# Patient Record
Sex: Female | Born: 1985 | Race: Black or African American | Hispanic: No | Marital: Married | Smoking: Never smoker
Health system: Southern US, Community
[De-identification: ages and names within clinical notes are randomized; demographics above are authoritative.]

## PROBLEM LIST (undated history)

## (undated) DIAGNOSIS — D649 Anemia, unspecified: Secondary | ICD-10-CM

## (undated) DIAGNOSIS — Z789 Other specified health status: Secondary | ICD-10-CM

## (undated) DIAGNOSIS — R030 Elevated blood-pressure reading, without diagnosis of hypertension: Secondary | ICD-10-CM

## (undated) DIAGNOSIS — R87629 Unspecified abnormal cytological findings in specimens from vagina: Secondary | ICD-10-CM

## (undated) DIAGNOSIS — E282 Polycystic ovarian syndrome: Secondary | ICD-10-CM

## (undated) HISTORY — PX: HERNIA REPAIR: SHX51

## (undated) HISTORY — PX: OTHER SURGICAL HISTORY: SHX169

## (undated) HISTORY — PX: DILATION AND CURETTAGE OF UTERUS: SHX78

## (undated) HISTORY — DX: Unspecified abnormal cytological findings in specimens from vagina: R87.629

---

## 2008-07-11 HISTORY — PX: DILATION AND CURETTAGE OF UTERUS: SHX78

## 2008-08-05 ENCOUNTER — Inpatient Hospital Stay (HOSPITAL_COMMUNITY): Admission: AD | Admit: 2008-08-05 | Discharge: 2008-08-05 | Payer: Self-pay | Admitting: Obstetrics & Gynecology

## 2008-08-12 ENCOUNTER — Inpatient Hospital Stay (HOSPITAL_COMMUNITY): Admission: RE | Admit: 2008-08-12 | Discharge: 2008-08-12 | Payer: Self-pay | Admitting: Obstetrics & Gynecology

## 2008-12-28 ENCOUNTER — Ambulatory Visit: Payer: Self-pay | Admitting: Obstetrics and Gynecology

## 2008-12-28 ENCOUNTER — Inpatient Hospital Stay (HOSPITAL_COMMUNITY): Admission: AD | Admit: 2008-12-28 | Discharge: 2008-12-28 | Payer: Self-pay | Admitting: Obstetrics & Gynecology

## 2009-01-24 ENCOUNTER — Inpatient Hospital Stay (HOSPITAL_COMMUNITY): Admission: AD | Admit: 2009-01-24 | Discharge: 2009-01-24 | Payer: Self-pay | Admitting: Obstetrics and Gynecology

## 2009-02-16 ENCOUNTER — Ambulatory Visit: Payer: Self-pay | Admitting: Advanced Practice Midwife

## 2009-02-16 ENCOUNTER — Inpatient Hospital Stay (HOSPITAL_COMMUNITY): Admission: AD | Admit: 2009-02-16 | Discharge: 2009-02-16 | Payer: Self-pay | Admitting: Obstetrics & Gynecology

## 2009-03-20 ENCOUNTER — Inpatient Hospital Stay (HOSPITAL_COMMUNITY): Admission: AD | Admit: 2009-03-20 | Discharge: 2009-03-21 | Payer: Self-pay | Admitting: Obstetrics

## 2009-05-15 ENCOUNTER — Ambulatory Visit (HOSPITAL_COMMUNITY): Admission: RE | Admit: 2009-05-15 | Discharge: 2009-05-15 | Payer: Self-pay | Admitting: Obstetrics

## 2009-06-07 ENCOUNTER — Inpatient Hospital Stay (HOSPITAL_COMMUNITY): Admission: AD | Admit: 2009-06-07 | Discharge: 2009-06-17 | Payer: Self-pay | Admitting: Obstetrics

## 2009-06-27 ENCOUNTER — Inpatient Hospital Stay (HOSPITAL_COMMUNITY): Admission: AD | Admit: 2009-06-27 | Discharge: 2009-07-09 | Payer: Self-pay | Admitting: Obstetrics

## 2009-06-27 ENCOUNTER — Ambulatory Visit: Payer: Self-pay | Admitting: Advanced Practice Midwife

## 2009-07-09 ENCOUNTER — Encounter: Payer: Self-pay | Admitting: Obstetrics

## 2009-07-15 ENCOUNTER — Inpatient Hospital Stay (HOSPITAL_COMMUNITY): Admission: AD | Admit: 2009-07-15 | Discharge: 2009-07-16 | Payer: Self-pay | Admitting: Obstetrics & Gynecology

## 2009-07-16 ENCOUNTER — Ambulatory Visit (HOSPITAL_COMMUNITY): Admission: RE | Admit: 2009-07-16 | Discharge: 2009-07-16 | Payer: Self-pay | Admitting: Obstetrics

## 2009-07-23 ENCOUNTER — Encounter: Payer: Self-pay | Admitting: Obstetrics

## 2009-07-23 ENCOUNTER — Ambulatory Visit (HOSPITAL_COMMUNITY): Admission: RE | Admit: 2009-07-23 | Discharge: 2009-07-23 | Payer: Self-pay | Admitting: Obstetrics

## 2009-07-23 ENCOUNTER — Inpatient Hospital Stay (HOSPITAL_COMMUNITY): Admission: AD | Admit: 2009-07-23 | Discharge: 2009-07-26 | Payer: Self-pay | Admitting: Obstetrics

## 2010-02-26 ENCOUNTER — Inpatient Hospital Stay (HOSPITAL_COMMUNITY): Admission: AD | Admit: 2010-02-26 | Discharge: 2010-02-27 | Payer: Self-pay | Admitting: Obstetrics & Gynecology

## 2010-02-26 ENCOUNTER — Ambulatory Visit: Payer: Self-pay | Admitting: Physician Assistant

## 2010-03-01 ENCOUNTER — Ambulatory Visit (HOSPITAL_COMMUNITY): Admission: RE | Admit: 2010-03-01 | Discharge: 2010-03-01 | Payer: Self-pay | Admitting: Obstetrics & Gynecology

## 2010-04-07 IMAGING — US US OB LIMITED
1 series · 18 of 24 positions shown · non-contrast
Comparison: none

OBSTETRICAL ULTRASOUND:
 This ultrasound was performed in The [HOSPITAL], and the [facility] report will be stored to [REDACTED] PACS.  This report is also available in [HOSPITAL]?s accessANYware.

[Series 1: us ob limited · 18 of 24 slices shown]
[im 1/24]
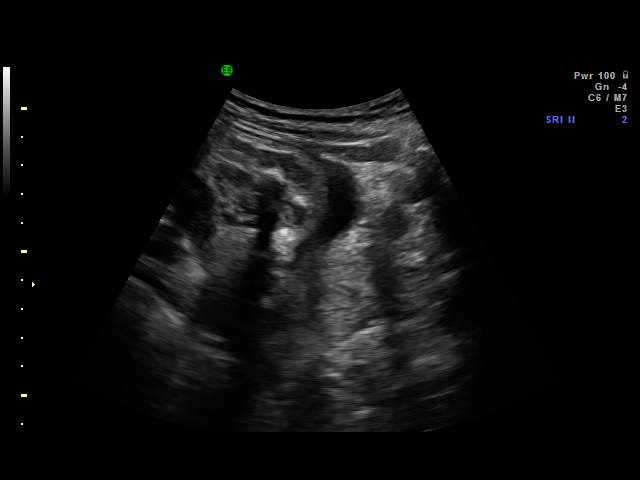
[im 3/24]
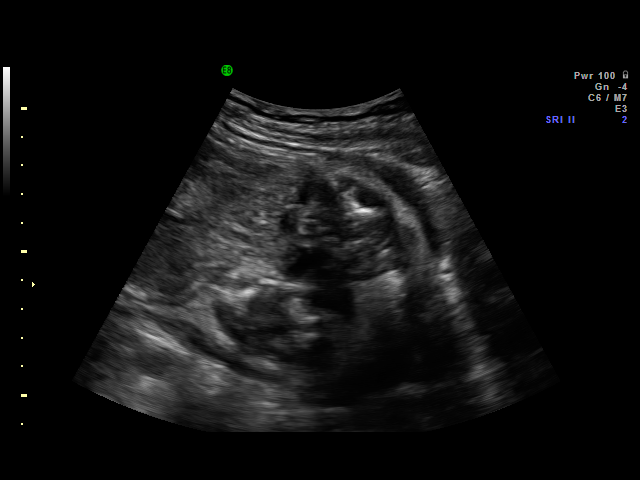
[im 4/24]
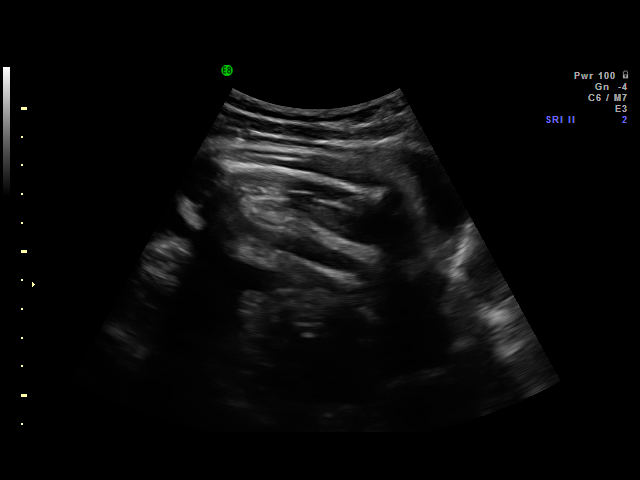
[im 5/24]
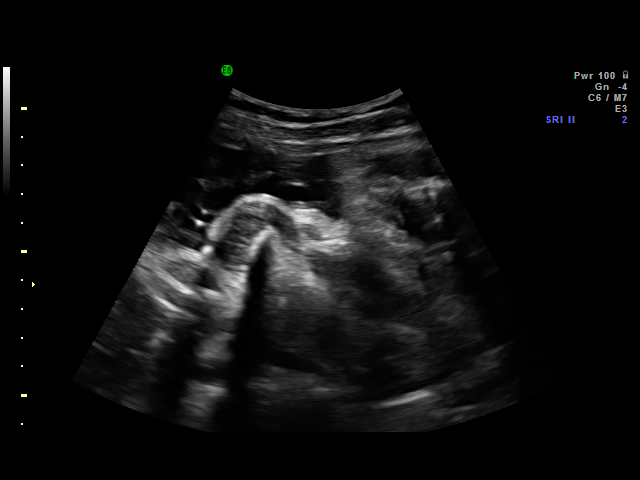
[im 7/24]
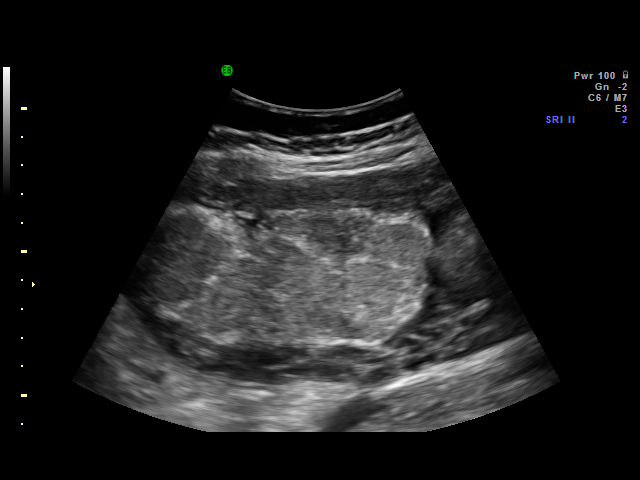
[im 8/24]
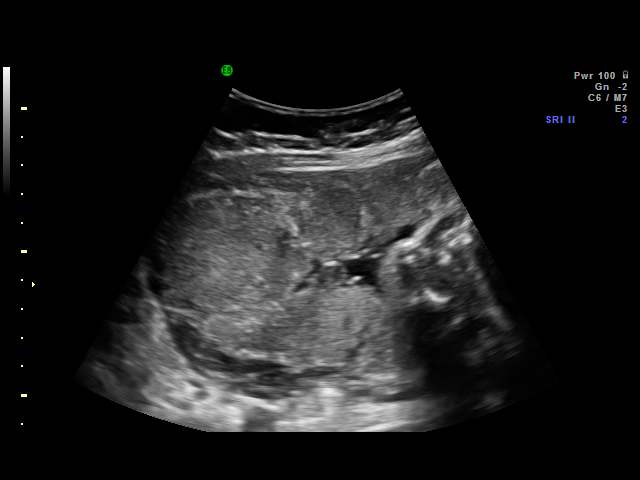
[im 9/24]
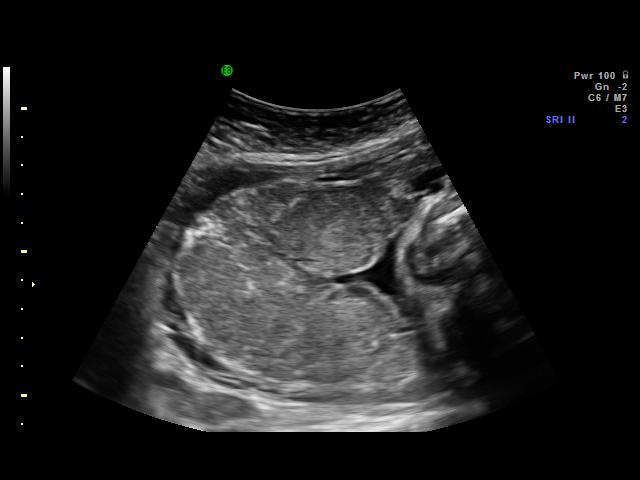
[im 11/24]
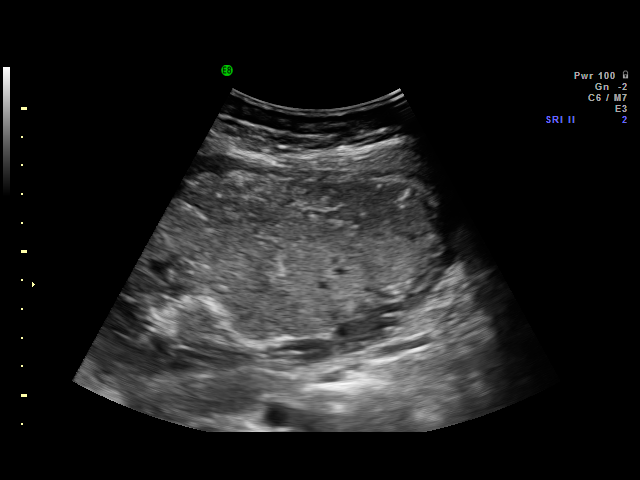
[im 12/24]
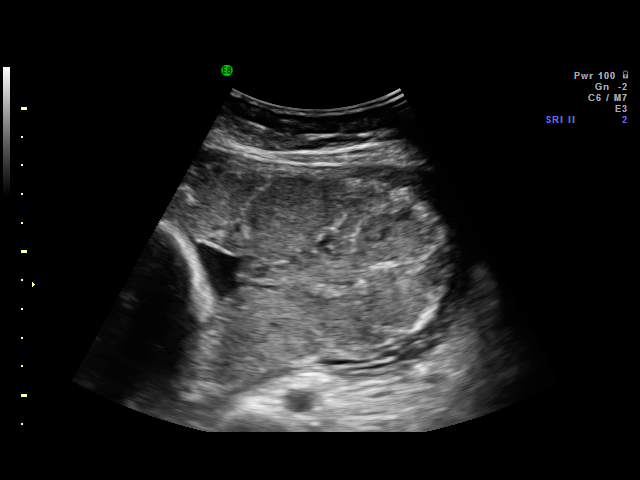
[im 13/24]
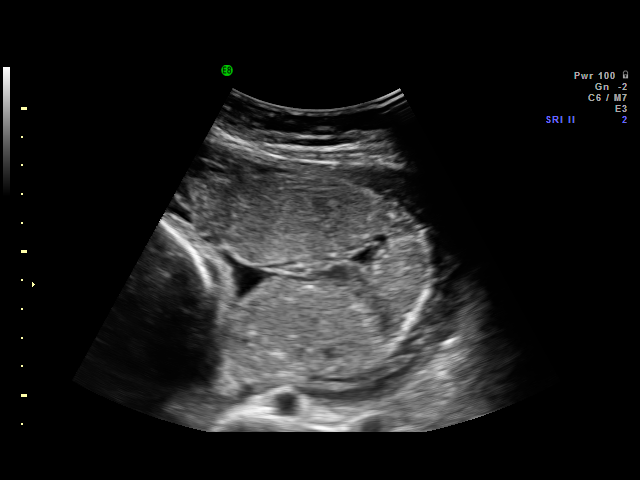
[im 15/24]
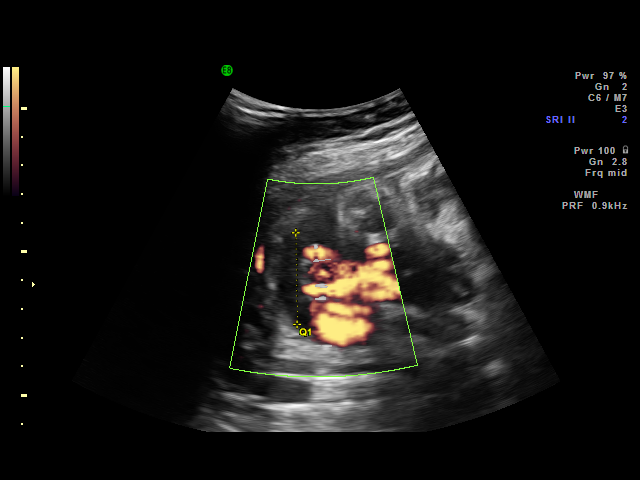
[im 16/24]
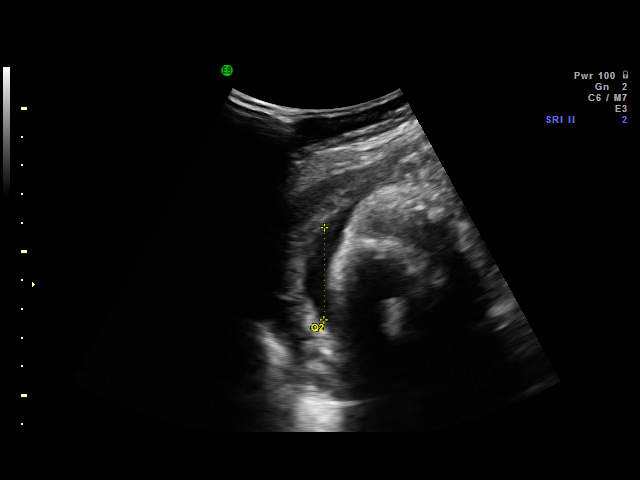
[im 17/24]
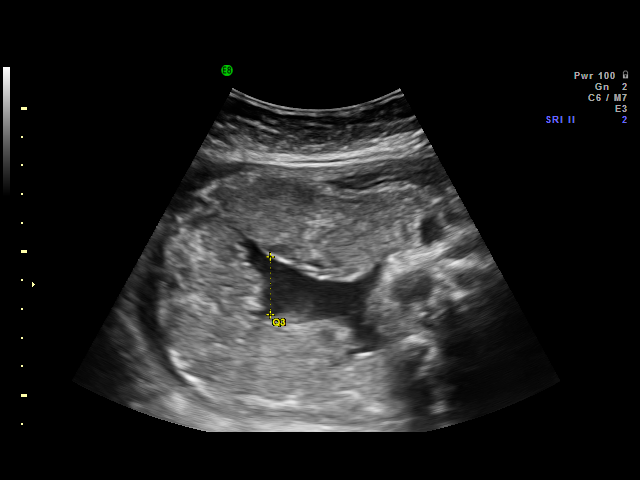
[im 19/24]
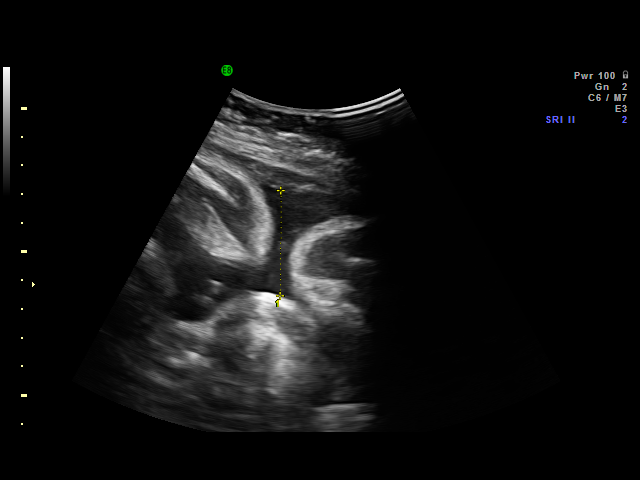
[im 20/24]
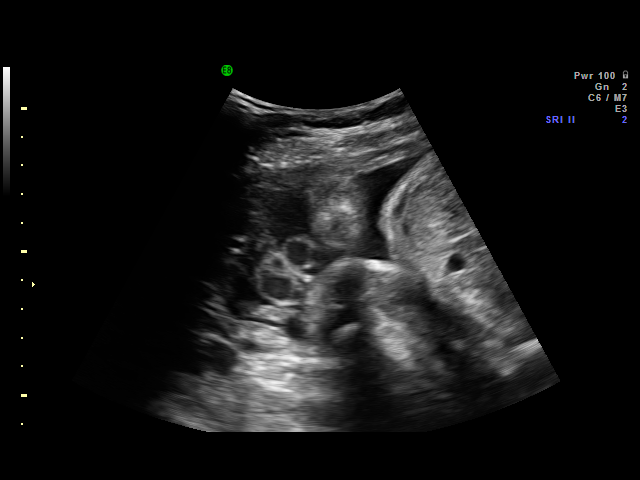
[im 21/24]
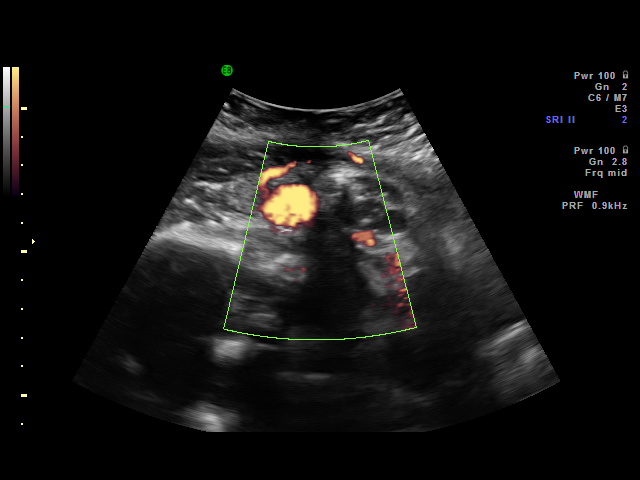
[im 23/24]
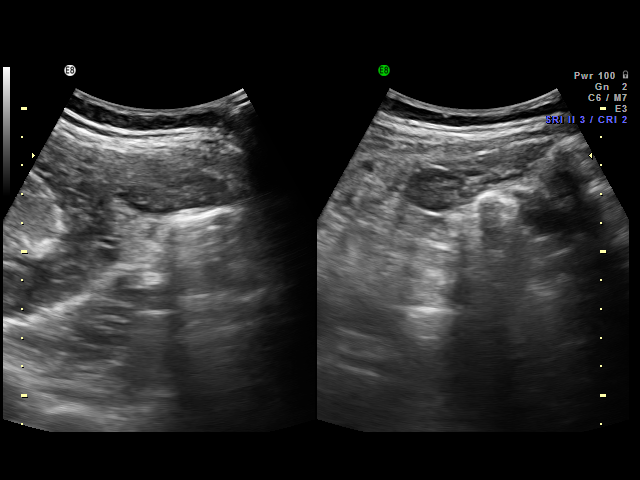
[im 24/24]
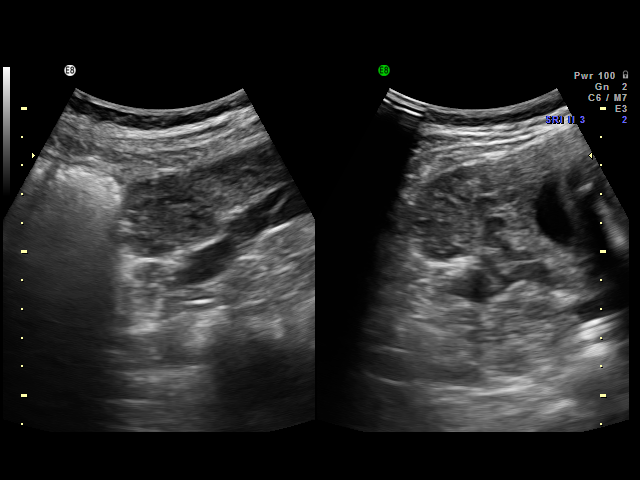

[18 of 24 positions shown; findings below may reference images not displayed]

IMPRESSION: [facility] has also been faxed to the ordering physician.

## 2010-04-13 ENCOUNTER — Inpatient Hospital Stay (HOSPITAL_COMMUNITY): Admission: AD | Admit: 2010-04-13 | Discharge: 2010-04-13 | Payer: Self-pay | Admitting: Family Medicine

## 2010-04-13 ENCOUNTER — Ambulatory Visit: Payer: Self-pay | Admitting: Gynecology

## 2010-05-07 ENCOUNTER — Inpatient Hospital Stay (HOSPITAL_COMMUNITY): Admission: AD | Admit: 2010-05-07 | Discharge: 2010-05-07 | Payer: Self-pay | Admitting: Obstetrics & Gynecology

## 2010-05-07 ENCOUNTER — Ambulatory Visit: Payer: Self-pay | Admitting: Obstetrics and Gynecology

## 2010-06-11 ENCOUNTER — Ambulatory Visit (HOSPITAL_COMMUNITY)
Admission: RE | Admit: 2010-06-11 | Discharge: 2010-06-11 | Payer: Self-pay | Source: Home / Self Care | Admitting: Obstetrics

## 2010-08-01 ENCOUNTER — Encounter: Payer: Self-pay | Admitting: Obstetrics

## 2010-08-01 ENCOUNTER — Encounter: Payer: Self-pay | Admitting: Obstetrics & Gynecology

## 2010-08-23 ENCOUNTER — Inpatient Hospital Stay (HOSPITAL_COMMUNITY)
Admission: AD | Admit: 2010-08-23 | Discharge: 2010-08-23 | Disposition: A | Discharge status: Home / Self Care | Payer: Medicaid Other | Source: Ambulatory Visit | Attending: Obstetrics & Gynecology | Admitting: Obstetrics & Gynecology

## 2010-08-23 DIAGNOSIS — R109 Unspecified abdominal pain: Secondary | ICD-10-CM | POA: Insufficient documentation

## 2010-08-23 DIAGNOSIS — O99891 Other specified diseases and conditions complicating pregnancy: Secondary | ICD-10-CM | POA: Insufficient documentation

## 2010-08-23 DIAGNOSIS — O9989 Other specified diseases and conditions complicating pregnancy, childbirth and the puerperium: Secondary | ICD-10-CM

## 2010-08-23 LAB — URINALYSIS, ROUTINE W REFLEX MICROSCOPIC
Ketones, ur: NEGATIVE mg/dL
Specific Gravity, Urine: 1.015 (ref 1.005–1.030)
Urine Glucose, Fasting: NEGATIVE mg/dL
pH: 6.5 (ref 5.0–8.0)

## 2010-09-22 LAB — URINALYSIS, ROUTINE W REFLEX MICROSCOPIC
Hgb urine dipstick: NEGATIVE
Nitrite: NEGATIVE
Protein, ur: NEGATIVE mg/dL
Specific Gravity, Urine: >1.03 — ABNORMAL HIGH (ref 1.005–1.030)
Urobilinogen, UA: 1 mg/dL (ref 0.0–1.0)

## 2010-09-22 LAB — WET PREP, GENITAL: Yeast Wet Prep HPF POC: NONE SEEN

## 2010-09-23 ENCOUNTER — Inpatient Hospital Stay (HOSPITAL_COMMUNITY)
Admission: AD | Admit: 2010-09-23 | Discharge: 2010-09-25 | DRG: 778 | Disposition: A | Discharge status: Home / Self Care | Payer: Medicaid Other | Source: Ambulatory Visit | Attending: Obstetrics & Gynecology | Admitting: Obstetrics & Gynecology

## 2010-09-23 DIAGNOSIS — O34219 Maternal care for unspecified type scar from previous cesarean delivery: Secondary | ICD-10-CM | POA: Diagnosis present

## 2010-09-23 DIAGNOSIS — O4700 False labor before 37 completed weeks of gestation, unspecified trimester: Principal | ICD-10-CM | POA: Diagnosis present

## 2010-09-23 LAB — COMPREHENSIVE METABOLIC PANEL
ALT: 27 U/L (ref 0–35)
Alkaline Phosphatase: 57 U/L (ref 39–117)
Chloride: 104 mEq/L (ref 96–112)
Glucose, Bld: 71 mg/dL (ref 70–99)
Potassium: 3.6 mEq/L (ref 3.5–5.1)
Sodium: 136 mEq/L (ref 135–145)
Total Bilirubin: 0.8 mg/dL (ref 0.3–1.2)
Total Protein: 6.9 g/dL (ref 6.0–8.3)

## 2010-09-23 LAB — CBC
HCT: 38.2 % (ref 36.0–46.0)
Hemoglobin: 12.8 g/dL (ref 12.0–15.0)
Platelets: 184 10*3/uL (ref 150–400)
RBC: 3.97 MIL/uL (ref 3.87–5.11)
RBC: 4.19 MIL/uL (ref 3.87–5.11)
RDW: 13 % (ref 11.5–15.5)
WBC: 9.6 10*3/uL (ref 4.0–10.5)
WBC: 9.6 10*3/uL (ref 4.0–10.5)

## 2010-09-23 LAB — URINALYSIS, ROUTINE W REFLEX MICROSCOPIC
Glucose, UA: NEGATIVE mg/dL
Hgb urine dipstick: NEGATIVE
Ketones, ur: 15 mg/dL — AB
Protein, ur: NEGATIVE mg/dL

## 2010-09-23 LAB — WET PREP, GENITAL

## 2010-09-23 LAB — GC/CHLAMYDIA PROBE AMP, GENITAL: GC Probe Amp, Genital: NEGATIVE

## 2010-09-24 LAB — WET PREP, GENITAL
Trich, Wet Prep: NONE SEEN
Yeast Wet Prep HPF POC: NONE SEEN

## 2010-09-24 LAB — URINALYSIS, ROUTINE W REFLEX MICROSCOPIC
Nitrite: NEGATIVE
Specific Gravity, Urine: >1.03 — ABNORMAL HIGH (ref 1.005–1.030)
pH: 6 (ref 5.0–8.0)

## 2010-09-26 ENCOUNTER — Inpatient Hospital Stay (HOSPITAL_COMMUNITY)
Admission: AD | Admit: 2010-09-26 | Discharge: 2010-09-26 | Disposition: A | Discharge status: Home / Self Care | Payer: Medicaid Other | Source: Ambulatory Visit | Attending: Obstetrics & Gynecology | Admitting: Obstetrics & Gynecology

## 2010-09-26 DIAGNOSIS — O4700 False labor before 37 completed weeks of gestation, unspecified trimester: Secondary | ICD-10-CM | POA: Insufficient documentation

## 2010-09-26 LAB — CBC
HCT: 37.3 % (ref 36.0–46.0)
Hemoglobin: 11.5 g/dL — ABNORMAL LOW (ref 12.0–15.0)
MCHC: 33.3 g/dL (ref 30.0–36.0)
MCV: 90.7 fL (ref 78.0–100.0)
Platelets: 227 10*3/uL (ref 150–400)
RDW: 13.4 % (ref 11.5–15.5)
RDW: 13.7 % (ref 11.5–15.5)
WBC: 11.3 10*3/uL — ABNORMAL HIGH (ref 4.0–10.5)

## 2010-09-29 NOTE — Discharge Summary (Signed)
  NAMEVENICE, MARCUCCI             ACCOUNT NO.:  [id number]  MEDICAL RECORD NO.:  [id number]           PATIENT TYPE:  I  LOCATION:  9157                          FACILITY:  [facility]  PHYSICIAN:  Roseanna Rainbow, M.D.DATE OF BIRTH:  05-07-1986  DATE OF ADMISSION:  09/23/2010 DATE OF DISCHARGE:  09/25/2010                              DISCHARGE SUMMARY   CHIEF COMPLAINT:  The patient is a 25 year old gravida 2, para 0-1-1-1 with an estimated date of confinement of April 15 with a history of a previous cesarean delivery, complaining of contractions.  HISTORY OF PRESENT ILLNESS:  Please see the above.  The patient has a history of previous preterm delivery via cesarean delivery at 34 weeks.  PHYSICAL EXAMINATION:  Vital signs afebrile.  Tocodynamometer uterine contractions every 5 minutes.  Fetal heart tracing baseline 140s, moderate long-term variability.  The cervix is fingertip long per the RN.  ASSESSMENT:  Intrauterine pregnancy at 35 weeks with a history of previous cesarean delivery, threatened labor.  PLAN:  Admission tocolysis with magnesium sulfate, bed rest.  HOSPITAL COURSE:  The patient was admitted.  Parenteral magnesium sulfate for tocolysis was started.  She was readily tocolyzed.  After a 24-hour period, the magnesium sulfate was discontinued.  After another 24-hour period of observation, there was no progressive preterm labor.  LABORATORY DATA:  Hemoglobin 11.2, white blood cell count of 9.6.  DISCHARGE DIAGNOSIS:  Intrauterine pregnancy at 35+ weeks, history of previous cesarean delivery, threatened preterm labor.  CONDITION:  Stable.  DIET:  Regular.  ACTIVITY:  Pelvic rest, decreased activity.  MEDICATIONS:  Prenatal vitamins.  DISPOSITION:  The patient was to follow up in the office in several days.     Roseanna Rainbow, M.D.     Judee Clara  D:  09/25/2010  T:  09/26/2010  Job:  [id number]  Electronically Signed by Antionette Char M.D.  on 09/29/2010 09:53:20 PM

## 2010-10-06 ENCOUNTER — Inpatient Hospital Stay (HOSPITAL_COMMUNITY)
Admission: AD | Admit: 2010-10-06 | Discharge: 2010-10-07 | Disposition: A | Discharge status: Home / Self Care | Payer: Medicaid Other | Source: Ambulatory Visit | Attending: Obstetrics & Gynecology | Admitting: Obstetrics & Gynecology

## 2010-10-06 DIAGNOSIS — O479 False labor, unspecified: Secondary | ICD-10-CM | POA: Insufficient documentation

## 2010-10-06 LAB — URINALYSIS, ROUTINE W REFLEX MICROSCOPIC
Glucose, UA: NEGATIVE mg/dL
Ketones, ur: >80 mg/dL — AB
Protein, ur: NEGATIVE mg/dL

## 2010-10-11 ENCOUNTER — Inpatient Hospital Stay (HOSPITAL_COMMUNITY)
Admission: AD | Admit: 2010-10-11 | Discharge: 2010-10-11 | Disposition: A | Discharge status: Home / Self Care | Payer: Medicaid Other | Source: Ambulatory Visit | Attending: Obstetrics | Admitting: Obstetrics

## 2010-10-11 ENCOUNTER — Inpatient Hospital Stay (HOSPITAL_COMMUNITY): Payer: Medicaid Other

## 2010-10-11 ENCOUNTER — Encounter (HOSPITAL_COMMUNITY): Payer: Self-pay | Admitting: Radiology

## 2010-10-11 DIAGNOSIS — O479 False labor, unspecified: Secondary | ICD-10-CM | POA: Insufficient documentation

## 2010-10-11 LAB — WET PREP, GENITAL
Clue Cells Wet Prep HPF POC: NONE SEEN
Trich, Wet Prep: NONE SEEN
Yeast Wet Prep HPF POC: NONE SEEN

## 2010-10-11 LAB — URINALYSIS, ROUTINE W REFLEX MICROSCOPIC
Bilirubin Urine: NEGATIVE
Glucose, UA: NEGATIVE mg/dL
Hgb urine dipstick: NEGATIVE
Ketones, ur: NEGATIVE mg/dL
pH: 6 (ref 5.0–8.0)

## 2010-10-11 LAB — URINE MICROSCOPIC-ADD ON

## 2010-10-11 LAB — CBC
HCT: 37.4 % (ref 36.0–46.0)
Hemoglobin: 12.6 g/dL (ref 12.0–15.0)
MCV: 91 fL (ref 78.0–100.0)
Platelets: 275 10*3/uL (ref 150–400)
RBC: 4.11 MIL/uL (ref 3.87–5.11)
WBC: 14.9 10*3/uL — ABNORMAL HIGH (ref 4.0–10.5)

## 2010-10-11 LAB — STREP B DNA PROBE

## 2010-10-13 ENCOUNTER — Other Ambulatory Visit (HOSPITAL_COMMUNITY): Payer: Medicaid Other

## 2010-10-13 LAB — URINALYSIS, ROUTINE W REFLEX MICROSCOPIC
Glucose, UA: NEGATIVE mg/dL
Ketones, ur: NEGATIVE mg/dL
Protein, ur: NEGATIVE mg/dL
Urobilinogen, UA: 0.2 mg/dL (ref 0.0–1.0)

## 2010-10-13 LAB — COMPREHENSIVE METABOLIC PANEL
BUN: 2 mg/dL — ABNORMAL LOW (ref 6–23)
CO2: 23 mEq/L (ref 19–32)
Chloride: 105 mEq/L (ref 96–112)
Creatinine, Ser: 0.51 mg/dL (ref 0.4–1.2)
GFR calc non Af Amer: >60 mL/min (ref >60)
Total Bilirubin: 0.3 mg/dL (ref 0.3–1.2)

## 2010-10-13 LAB — STREP B DNA PROBE

## 2010-10-13 LAB — CBC
HCT: 33.9 % — ABNORMAL LOW (ref 36.0–46.0)
MCV: 91.3 fL (ref 78.0–100.0)
RBC: 3.71 MIL/uL — ABNORMAL LOW (ref 3.87–5.11)
WBC: 13.1 10*3/uL — ABNORMAL HIGH (ref 4.0–10.5)

## 2010-10-13 LAB — WET PREP, GENITAL: Yeast Wet Prep HPF POC: NONE SEEN

## 2010-10-14 ENCOUNTER — Encounter (HOSPITAL_COMMUNITY)
Admission: RE | Admit: 2010-10-14 | Discharge: 2010-10-14 | Disposition: A | Discharge status: Home / Self Care | Payer: Medicaid Other | Source: Ambulatory Visit | Attending: Obstetrics | Admitting: Obstetrics

## 2010-10-14 LAB — CBC
MCH: 27.1 pg (ref 26.0–34.0)
MCV: 86.6 fL (ref 78.0–100.0)
Platelets: 176 10*3/uL (ref 150–400)
RDW: 13.8 % (ref 11.5–15.5)

## 2010-10-15 LAB — URINALYSIS, ROUTINE W REFLEX MICROSCOPIC
Glucose, UA: NEGATIVE mg/dL
Ketones, ur: NEGATIVE mg/dL
Specific Gravity, Urine: >1.03 — ABNORMAL HIGH (ref 1.005–1.030)
pH: 5.5 (ref 5.0–8.0)

## 2010-10-15 LAB — WET PREP, GENITAL
Clue Cells Wet Prep HPF POC: NONE SEEN
Trich, Wet Prep: NONE SEEN

## 2010-10-15 LAB — COMPREHENSIVE METABOLIC PANEL
ALT: 28 U/L (ref 0–35)
Calcium: 9.1 mg/dL (ref 8.4–10.5)
Creatinine, Ser: 0.48 mg/dL (ref 0.4–1.2)
GFR calc Af Amer: >60 mL/min (ref >60)
Glucose, Bld: 75 mg/dL (ref 70–99)
Sodium: 135 mEq/L (ref 135–145)
Total Protein: 6.8 g/dL (ref 6.0–8.3)

## 2010-10-15 LAB — GC/CHLAMYDIA PROBE AMP, GENITAL: GC Probe Amp, Genital: NEGATIVE

## 2010-10-18 ENCOUNTER — Inpatient Hospital Stay (HOSPITAL_COMMUNITY)
Admission: RE | Admit: 2010-10-18 | Discharge: 2010-10-21 | DRG: 766 | Disposition: A | Discharge status: Home / Self Care | Payer: Medicaid Other | Source: Ambulatory Visit | Attending: Obstetrics | Admitting: Obstetrics

## 2010-10-18 ENCOUNTER — Other Ambulatory Visit: Payer: Self-pay | Admitting: Obstetrics

## 2010-10-18 DIAGNOSIS — Z302 Encounter for sterilization: Secondary | ICD-10-CM

## 2010-10-18 DIAGNOSIS — O34219 Maternal care for unspecified type scar from previous cesarean delivery: Principal | ICD-10-CM | POA: Diagnosis present

## 2010-10-18 LAB — URINALYSIS, ROUTINE W REFLEX MICROSCOPIC
Leukocytes, UA: NEGATIVE
Nitrite: NEGATIVE
Protein, ur: NEGATIVE mg/dL
Specific Gravity, Urine: >1.03 — ABNORMAL HIGH (ref 1.005–1.030)
Urobilinogen, UA: 0.2 mg/dL (ref 0.0–1.0)

## 2010-10-18 LAB — CBC
HCT: 36.7 % (ref 36.0–46.0)
Hemoglobin: 12.4 g/dL (ref 12.0–15.0)
MCV: 89.5 fL (ref 78.0–100.0)
Platelets: 213 10*3/uL (ref 150–400)
RDW: 13.1 % (ref 11.5–15.5)

## 2010-10-18 LAB — URINE MICROSCOPIC-ADD ON

## 2010-10-18 LAB — POCT PREGNANCY, URINE: Preg Test, Ur: POSITIVE

## 2010-10-19 LAB — CBC
HCT: 32.3 % — ABNORMAL LOW (ref 36.0–46.0)
MCH: 27 pg (ref 26.0–34.0)
MCV: 85.4 fL (ref 78.0–100.0)
Platelets: 169 10*3/uL (ref 150–400)
RDW: 13.7 % (ref 11.5–15.5)

## 2010-10-25 LAB — CBC
HCT: 36.1 % (ref 36.0–46.0)
Hemoglobin: 12.3 g/dL (ref 12.0–15.0)
MCHC: 34 g/dL (ref 30.0–36.0)
Platelets: 235 10*3/uL (ref 150–400)
RDW: 12.8 % (ref 11.5–15.5)

## 2010-10-25 LAB — URINE MICROSCOPIC-ADD ON

## 2010-10-25 LAB — WET PREP, GENITAL
Clue Cells Wet Prep HPF POC: NONE SEEN
Trich, Wet Prep: NONE SEEN

## 2010-10-25 LAB — ABO/RH: ABO/RH(D): A POS

## 2010-10-25 LAB — URINALYSIS, ROUTINE W REFLEX MICROSCOPIC
Bilirubin Urine: NEGATIVE
Glucose, UA: NEGATIVE mg/dL
Specific Gravity, Urine: 1.025 (ref 1.005–1.030)
Urobilinogen, UA: 0.2 mg/dL (ref 0.0–1.0)

## 2010-10-25 LAB — GC/CHLAMYDIA PROBE AMP, GENITAL: GC Probe Amp, Genital: NEGATIVE

## 2010-10-25 NOTE — Discharge Summary (Signed)
  Robin Chapman, Robin Chapman             ACCOUNT NO.:  [id number]  MEDICAL RECORD NO.:  [id number]           PATIENT TYPE:  I  LOCATION:  9102                          [facility]ILITY:  [facility]  PHYSICIAN:  Roseanna Rainbow, M.D.DATE OF BIRTH:  December 31, 1985  DATE OF ADMISSION:  10/18/2010 DATE OF DISCHARGE:  10/21/2010                              DISCHARGE SUMMARY   CHIEF COMPLAINT:  The patient is a 25 year old gravida 3, para 1-0-1-1 with an estimated date of confinement of April 16 with a history of previous cesarean delivery.  The patient declines a TOLAC.  HISTORY OF PRESENT ILLNESS:  Please see the above.  The patient also desires a sterilization procedure.  ALLERGIES:  No known drug allergies.  MEDICATIONS:  Prenatal vitamins.  PAST SURGICAL HISTORY:  Please see the above herniorrhaphy.  PAST MEDICAL HISTORY:  She denies.  SOCIAL HISTORY:  She denies any tobacco, ethanol, or drug use.  She is single.  PHYSICAL EXAMINATION:  VITAL SIGNS:  Stable, afebrile. GENERAL APPEARANCE:  Well-nourished, well-developed. HEAD, EYES, EARS, NOSE AND THROAT:  Normocephalic, atraumatic. NECK:  Supple. LUNGS:  Clear to auscultation bilaterally. HEART:  Regular rate and rhythm. ABDOMEN:  Soft, gravid, nontender. PELVIC:  Cervix is closed. EXTREMITIES:  No clubbing, cyanosis, or edema. SKIN:  Without rash.  ASSESSMENT:  Intrauterine pregnancy at term, history of a previous cesarean delivery, declined for trial of labor after cesarean.  Desires sterilization procedure.  PLAN:  Admission, repeat cesarean delivery, and bilateral tubal ligation.  HOSPITAL COURSE:  The patient was admitted and underwent a repeat cesarean delivery and bilateral tubal ligation.  Please see the dictated operative summary for findings.  On postoperative day #1, hemoglobin was 10.2.  The remainder of her hospital course was uneventful.  She was discharged to home on postoperative day #3.  DISCHARGE DIAGNOSES:   Intrauterine pregnancy at term, history of a previous cesarean delivery, declines trial of labor after cesarean but desires sterilization procedure.  PROCEDURES:  Repeat cesarean delivery and bilateral tubal ligation.  CONDITION:  Good.  DIET:  Regular.  ACTIVITY:  Pelvic rest, progressive activity.  MEDICATIONS:  Percocet 5/325, 1-2 tablets every 6 hours as needed.  DISPOSITION:  The patient is to follow up in the office in 2 weeks.     Roseanna Rainbow, M.D.     Judee Clara  D:  10/21/2010  T:  10/21/2010  Job:  [id number]  Electronically Signed by Antionette Char M.D. on 10/24/2010 01:06:42 PM

## 2010-11-18 NOTE — Op Note (Signed)
Robin Chapman, Robin Chapman             ACCOUNT NO.:  [id number]  MEDICAL RECORD NO.:  [id number]           PATIENT TYPE:  LOCATION:                                 FACILITY:  PHYSICIAN:  Charles A. Clearance Coots, M.D.DATE OF BIRTH:  [birth year]  DATE OF PROCEDURE: DATE OF DISCHARGE:                              OPERATIVE REPORT   PREOPERATIVE DIAGNOSES:  Term pregnancy, previous cesarean section, desire repeat cesarean section, desire tubal sterilization.  POSTOPERATIVE DIAGNOSES:  Term pregnancy, previous cesarean section, desire repeat cesarean section, desire tubal sterilization.  PROCEDURE:  Repeat low transverse cesarean section and bilateral partial salpingectomy.  SURGEON:  Charles A. Clearance Coots, MD  ASSISTANT:  Mackey Birchwood, certified OR technician.  ANESTHESIA:  Spinal.  ESTIMATED BLOOD LOSS:  600 mL  IV FLUIDS:  1600 mL  URINE OUTPUT:  100 mL clear  COMPLICATIONS:  None.  Foley to gravity.  FINDINGS:  Viable female at 1312, Apgars of 9 at 1 minute, 9 at 5 minutes, weight of 7 pounds 10 ounces.  SPECIMEN:  Placenta.  DISPOSITION:  Specimen to pathology.  OPERATION:  The patient was brought to the operating room and after satisfactory spinal anesthesia, the abdomen was prepped and draped in the usual sterile fashion.  Pfannenstiel skin incision was made with a scalpel through the previous scar down to the fascia.  The fascia was nicked in the midline and the fascial incision was extended to left and to the right with curved Mayo scissors.  The superior and inferior fascial edges were taken off of the rectus muscle both blunt sharp dissection.  The rectus muscles sharply divided in the midline being careful to avoid the urinary bladder inferiorly.  The peritoneum was entered and was digitally extended to left and to the right, and the bladder blade was positioned in the incision.  The vesicouterine fold of peritoneum above the reflection of the urinary bladder was  grasped with forceps and was incised and undermined with Metzenbaum scissors.  The incision was extended to the left and to the right with Metzenbaum scissors.  The bladder flap was bluntly developed and the bladder blade was repositioned in front of the urinary bladder placing it well out of the operative field.  The uterus was then entered transversely in the lower uterine segment with a scalpel.  The uterine incision was extended to left and to the right with the bandage scissors.  The occiput was then brought up into the incision and the vertex was delivered with the aid of fundal pressure from the assistant.  The infant's mouth and nose were suctioned with a suction bulb and the delivery was completed with the aid of fundal pressure from the assistant.  Umbilical cord was doubly clamped and cut and the infant was handed off to the nursery staff.  Cord blood was obtained and the placenta was spontaneously expelled from the uterine cavity intact.  The endometrial surface was thoroughly debrided with a dry lap sponge.  The edges of the uterine incision were grasped with ring forceps.  Uterus was closed with a continuous interlocking suture of 0 Monocryl.  Hemostasis was excellent. Attention was then  turned above to the tubal ligation procedure.  The left fallopian tube was grasped in the isthmic area with a Babcock clamp and was followed from a corneal end to the fimbrial end and grasped with a Babcock clamp.  The knuckle of tube beneath the Babcock clamp was suture ligated through the mesosalpinx with 0 plain catgut.  Section of tube above the knot was excised with Metzenbaum scissors and submitted to pathology for evaluation.  There was no active bleeding from the tubal stumps and they were placed back in a normal anatomic position. Same procedure was performed on the opposite side without complications. The abdomen was then closed as follows:  the parietal peritoneum was closed with  a continuous suture of 2-0 Monocryl.  The fascia was closed with continuous suture of 0 PDS from each corner to the center. Subcutaneous tissue was thoroughly irrigated with warm saline solution. All areas of subcutaneous bleeding were coagulated with the Bovie.  Skin was then closed with surgical stainless steel staples.  Sterile bandage was applied to the incision closure.  Surgical technician indicated that all needle, sponge and instrument counts were correct x2.  The patient tolerated the procedure well, transported to the recovery room in satisfactory condition.     Charles A. Clearance Coots, M.D.     CAH/MEDQ  D:  11/12/2010  T:  11/13/2010  Job:  [id number]  Electronically Signed by Coral Ceo M.D. on 11/18/2010 09:16:11 AM

## 2011-03-10 IMAGING — US US OB DETAIL+14 WK
1 series · 12 of 28 positions shown · non-contrast
Comparison: none

[Series 1: us ob detail+14 wk · 12 of 96 slices shown]
[im 4/96]
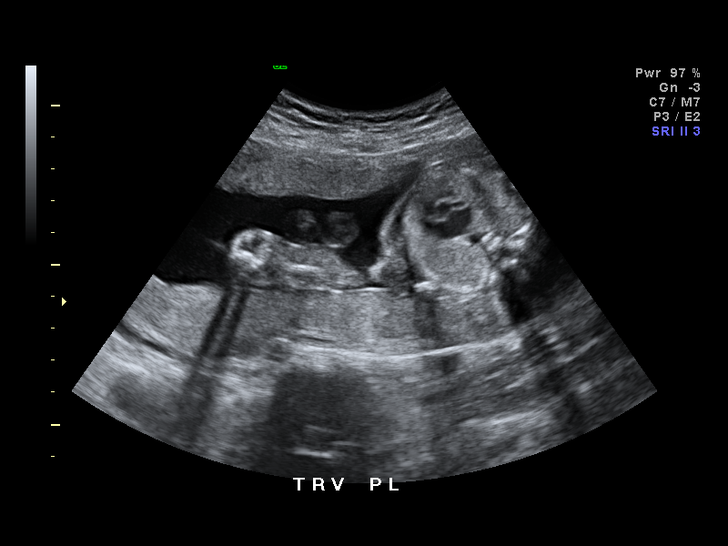
[im 11/96]
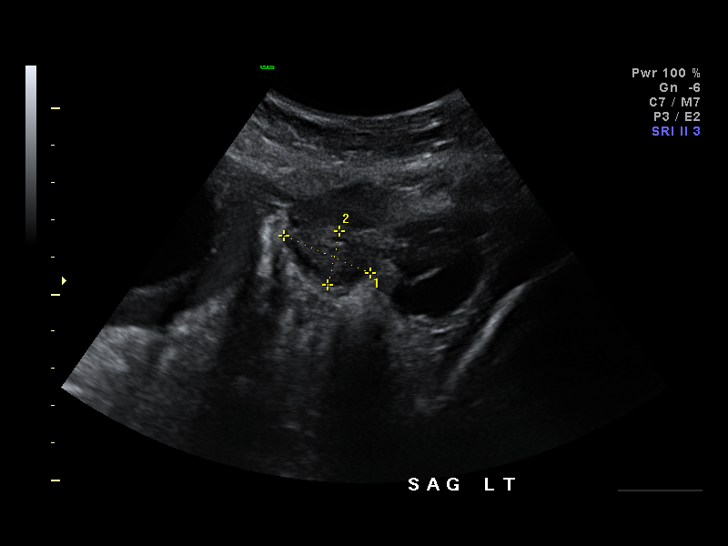
[im 18/96]
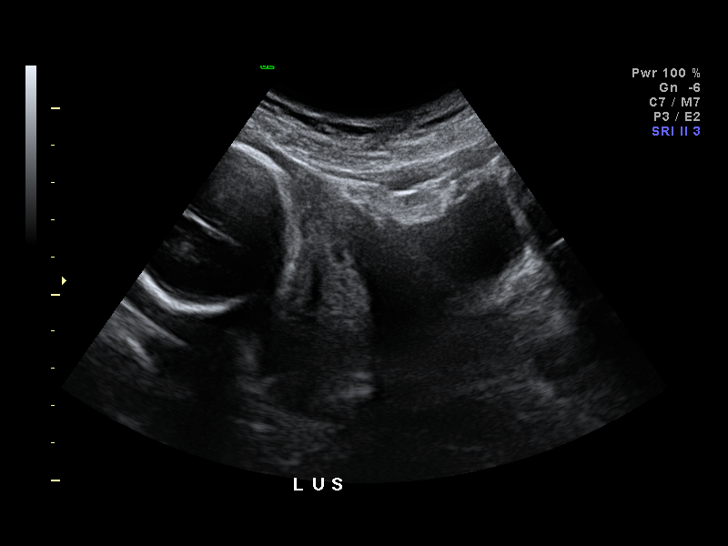
[im 29/96]
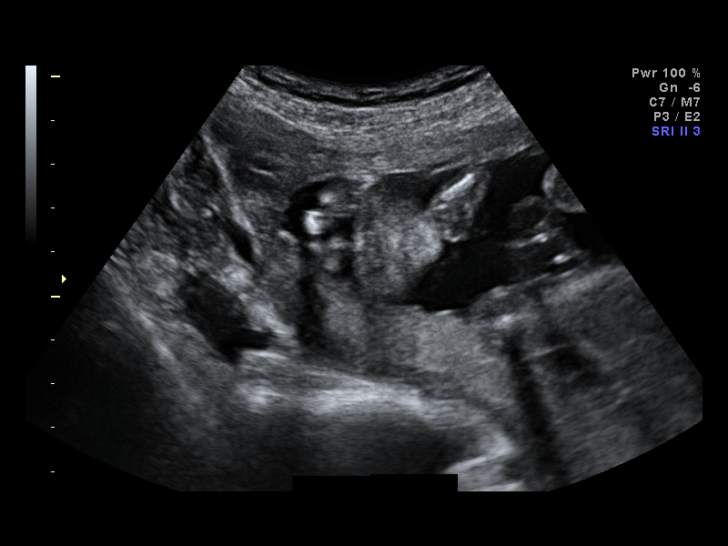
[im 36/96]
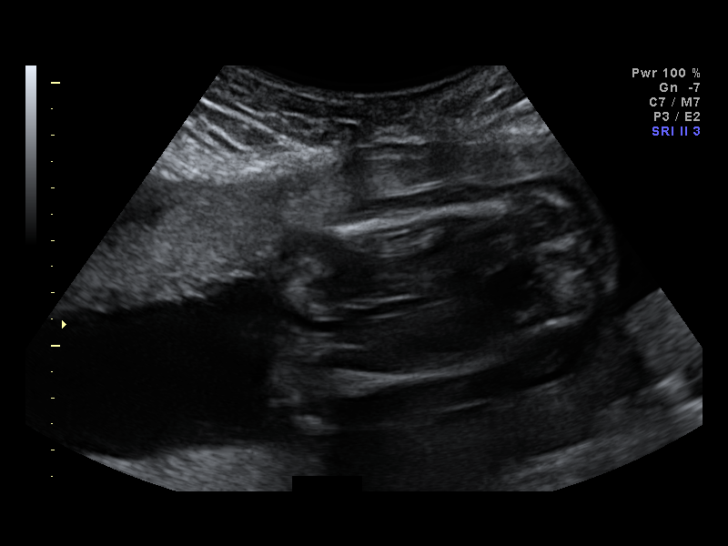
[im 43/96]
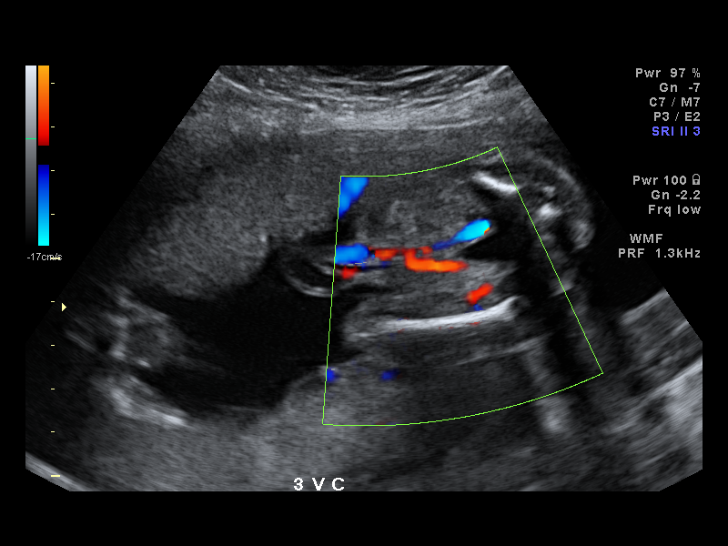
[im 53/96]
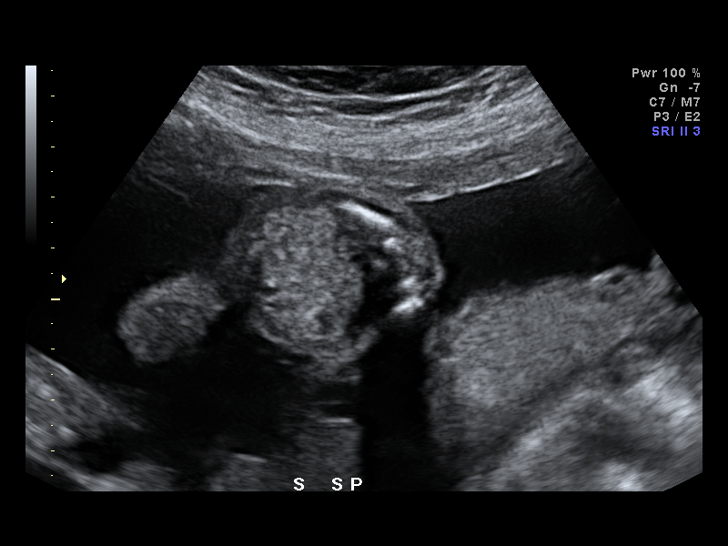
[im 60/96]
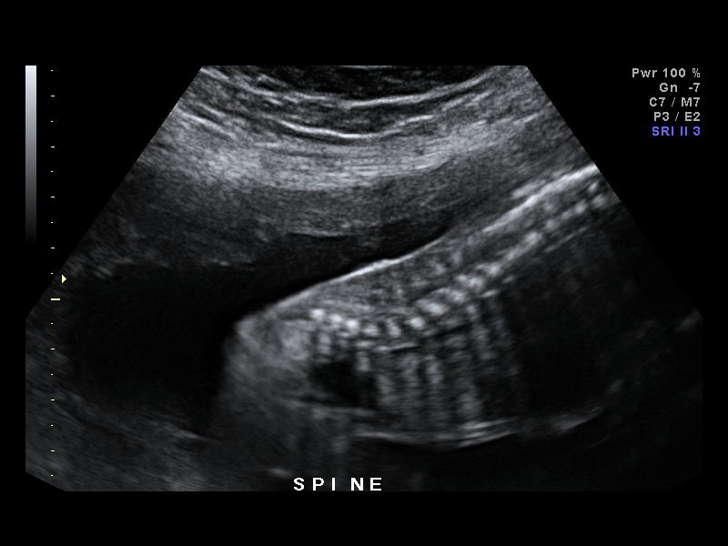
[im 67/96]
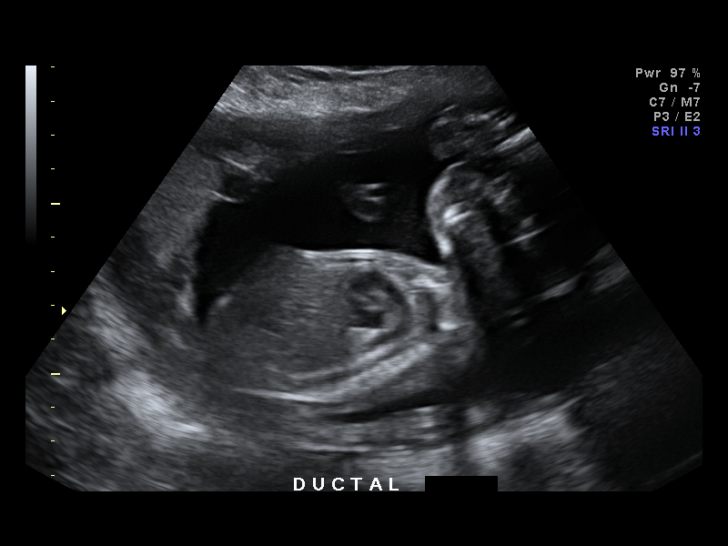
[im 78/96]
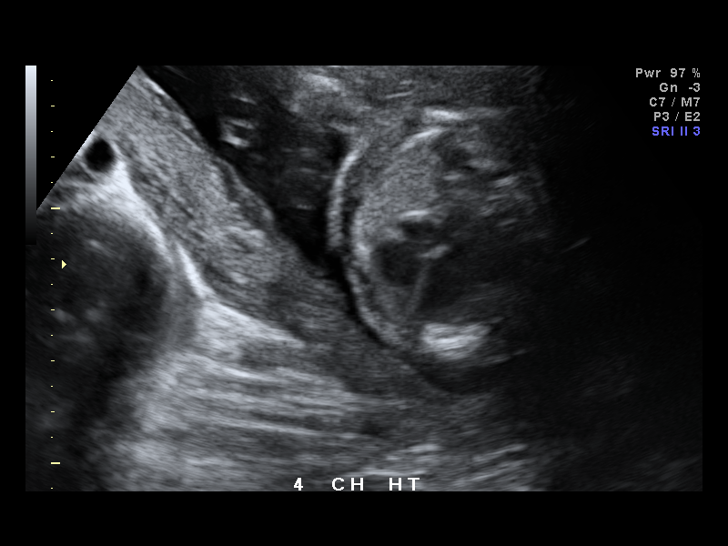
[im 85/96]
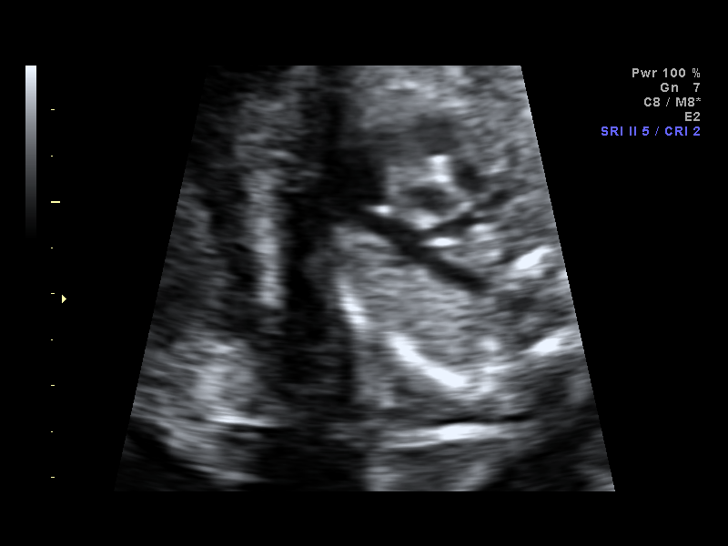
[im 92/96]
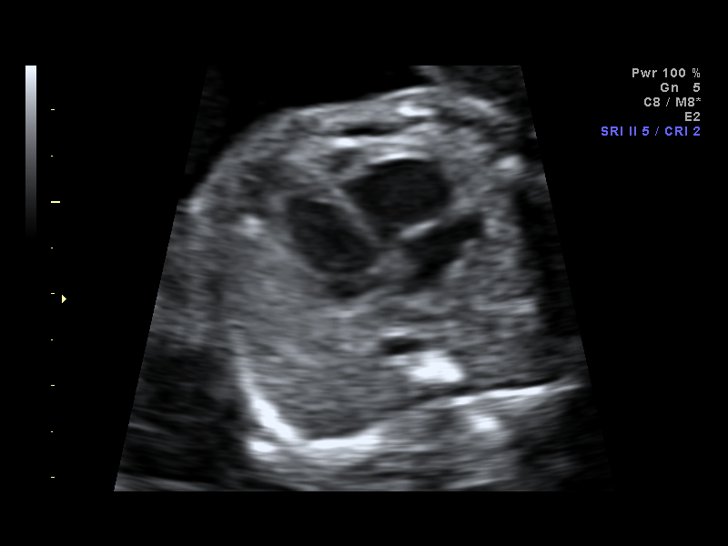

[12 of 28 positions shown; findings below may reference images not displayed]

OBSTETRICS REPORT
                      (Signed Final 06/11/2010 [DATE])

 Order#:         _O
Procedures

 US OB DETAIL +14 WK                                   76811.0
Indications

 H/O preterm labor; ?PPROM, oligohydramnios,
 NRFHTs
 Previous cesarean section
 Poor obstetric history: Previous preterm delivery at
 34 weeks
 Detailed fetal anatomic survey
Fetal Evaluation

 Fetal Heart Rate:  145                          bpm
 Cardiac Activity:  Observed
 Presentation:      Cephalic
 Placenta:          Posterior, above cervical
                    os
 P. Cord            Visualized
 Insertion:

 Amniotic Fluid
 AFI FV:      Subjectively within normal limits
                                             Larg Pckt:     3.2  cm
Biometry

 BPD:     48.4  mm     G. Age:  20w 4d                CI:         72.6   70 - 86
 OFD:     66.7  mm                                    FL/HC:      20.1   15.9 -

 HC:     184.9  mm     G. Age:  20w 6d       40  %    HC/AC:      1.20   1.06 -

 AC:     154.4  mm     G. Age:  20w 4d       36  %    FL/BPD:
 FL:      37.1  mm     G. Age:  21w 6d       74  %    FL/AC:      24.0   20 - 24
 HUM:       36  mm     G. Age:  22w 4d       88  %

 Est. FW:     401  gm    0 lb 14 oz      50  %
Gestational Age

 U/S Today:     21w 0d                                        EDD:   10/22/10
 Best:          20w 6d     Det. By:  U/S C R L (03/01/10)     EDD:   10/23/10
Anatomy

 Cranium:           Appears normal      Aortic Arch:       Appears normal
 Fetal Cavum:       Appears normal      Ductal Arch:       Appears normal
 Ventricles:        Appears normal      Diaphragm:         Appears normal
 Choroid Plexus:    Appears normal      Stomach:           Appears
                                                           normal, left
                                                           sided
 Cerebellum:        Appears normal      Abdomen:           Appears normal
 Posterior Fossa:   Appears normal      Abdominal Wall:    Appears nml
                                                           (cord insert,
                                                           abd wall)
 Nuchal Fold:       Not applicable      Cord Vessels:      Appears normal
                    (>20 wks GA)                           (3 vessel cord)
 Face:              Appears normal      Kidneys:           Appear normal
                    (lips/profile/orbit
                    s)
 Heart:             Appears normal      Bladder:           Appears normal
                    (4 chamber &
                    axis)
 RVOT:              Appears normal      Spine:             Appears normal
 LVOT:              Appears normal      Limbs:             Appears normal
                                                           (hands, ankles,
                                                           feet)

 Other:     Fetus appears to be a male. Heels and 5th digit appears
            normal.
Cervix Uterus Adnexa

 Cervical Length:    3        cm

 Cervix:       Normal appearance by transabdominal scan. Appears
               closed, without funnelling.

 Left Ovary:    Within normal limits.
 Right Ovary:   Within normal limits.
Impression

 IUP at 20+6 weeks
 Normal level II fetal anatomy
 Markers of aneuploidy: none
 Normal amniotic fluid volume
 Measurements consistant with prior U/S
Recommendations

 Follow-up as clinically indicated.

## 2012-05-08 ENCOUNTER — Encounter: Payer: Self-pay | Admitting: Obstetrics and Gynecology

## 2012-05-24 ENCOUNTER — Encounter: Payer: Medicaid Other | Admitting: Obstetrics and Gynecology

## 2012-10-13 ENCOUNTER — Inpatient Hospital Stay (HOSPITAL_COMMUNITY)
Admission: AD | Admit: 2012-10-13 | Discharge: 2012-10-13 | Disposition: A | Discharge status: Home / Self Care | Payer: Self-pay | Source: Ambulatory Visit | Attending: Family Medicine | Admitting: Family Medicine

## 2012-10-13 ENCOUNTER — Encounter (HOSPITAL_COMMUNITY): Payer: Self-pay | Admitting: *Deleted

## 2012-10-13 DIAGNOSIS — R109 Unspecified abdominal pain: Secondary | ICD-10-CM | POA: Insufficient documentation

## 2012-10-13 DIAGNOSIS — N912 Amenorrhea, unspecified: Secondary | ICD-10-CM | POA: Insufficient documentation

## 2012-10-13 LAB — CBC WITH DIFFERENTIAL/PLATELET
Basophils Absolute: 0 10*3/uL (ref 0.0–0.1)
Basophils Relative: 0 % (ref 0–1)
Eosinophils Absolute: 0.1 10*3/uL (ref 0.0–0.7)
Hemoglobin: 12.7 g/dL (ref 12.0–15.0)
MCH: 29 pg (ref 26.0–34.0)
MCHC: 32.9 g/dL (ref 30.0–36.0)
Monocytes Relative: 5 % (ref 3–12)
Neutro Abs: 4.4 10*3/uL (ref 1.7–7.7)
Neutrophils Relative %: 62 % (ref 43–77)
Platelets: 224 10*3/uL (ref 150–400)
RDW: 13 % (ref 11.5–15.5)

## 2012-10-13 LAB — WET PREP, GENITAL
Clue Cells Wet Prep HPF POC: NONE SEEN
Trich, Wet Prep: NONE SEEN

## 2012-10-13 LAB — URINALYSIS, ROUTINE W REFLEX MICROSCOPIC
Glucose, UA: NEGATIVE mg/dL
Ketones, ur: NEGATIVE mg/dL
Leukocytes, UA: NEGATIVE
Nitrite: NEGATIVE
Protein, ur: NEGATIVE mg/dL
Urobilinogen, UA: 0.2 mg/dL (ref 0.0–1.0)

## 2012-10-13 LAB — POCT PREGNANCY, URINE: Preg Test, Ur: NEGATIVE

## 2012-10-13 NOTE — MAU Provider Note (Signed)
Chart reviewed and agree with management and plan.  

## 2012-10-13 NOTE — MAU Provider Note (Signed)
History     CSN: [id number]  Arrival date and time: 10/13/12 1036   First Provider Initiated Contact with Patient 10/13/12 1115      Chief Complaint  Patient presents with  . Amenorrhea   HPI Robin Chapman is 27 y.o. G2P2 Hx BTL 2 years after the delivery of her last child.  Has not had a period since with the exception of a 3 day cycle in November of 2013.  Began with abdominal pain X 1 year off and on.   None at this time.  When she has it she rateds it a 5-6/10.  Describes as crampy-sharp--like she had premenstrually.  Denies fever, chills, nausea and vomiting.  1 partner X 6 years.  Reports discomfort after intercourse X 2 months.     No past medical history on file.  No past surgical history on file.  No family history on file.  History  Substance Use Topics  . Smoking status: Not on file  . Smokeless tobacco: Not on file  . Alcohol Use: Not on file    Allergies: No Known Allergies  No prescriptions prior to admission    Review of Systems  Constitutional: Negative for fever and chills.  Gastrointestinal: Positive for abdominal pain (intermittent X 1 yrs).  Genitourinary: Negative for dysuria, urgency and frequency.       Amenorrhea X 2 years   Physical Exam   Blood pressure 133/89, pulse 88, resp. rate 18, height 5\' 3"  (1.6 m), weight 173 lb (78.472 kg), last menstrual period 05/15/2012, unknown if currently breastfeeding.  Physical Exam  Constitutional: She is oriented to person, place, and time. She appears well-developed and well-nourished. No distress.  HENT:  Head: Normocephalic.  Neck: Normal range of motion.  Cardiovascular: Normal rate.   Respiratory: Effort normal.  GI: Soft. She exhibits no distension and no mass. There is no tenderness. There is no rebound and no guarding.  Genitourinary: There is no tenderness, lesion or injury on the right labia. There is no tenderness, lesion or injury on the left labia. Uterus is not enlarged and not tender.  Cervix exhibits no motion tenderness, no discharge and no friability. Right adnexum displays no mass, no tenderness and no fullness. Left adnexum displays no mass, no tenderness and no fullness. No erythema, tenderness or bleeding around the vagina. Injury: small amount of white discharge without odor. Vaginal discharge found.  Neurological: She is alert and oriented to person, place, and time.  Skin: Skin is warm and dry.  Psychiatric: She has a normal mood and affect. Her behavior is normal.   Results for orders placed during the hospital encounter of 10/13/12 (from the past 24 hour(s))  URINALYSIS, ROUTINE W REFLEX MICROSCOPIC     Status: Abnormal   Collection Time    10/13/12 10:45 AM      Result Value Range   Color, Urine YELLOW  YELLOW   APPearance CLEAR  CLEAR   Specific Gravity, Urine >1.030 (*) 1.005 - 1.030   pH 5.5  5.0 - 8.0   Glucose, UA NEGATIVE  NEGATIVE mg/dL   Hgb urine dipstick NEGATIVE  NEGATIVE   Bilirubin Urine NEGATIVE  NEGATIVE   Ketones, ur NEGATIVE  NEGATIVE mg/dL   Protein, ur NEGATIVE  NEGATIVE mg/dL   Urobilinogen, UA 0.2  0.0 - 1.0 mg/dL   Nitrite NEGATIVE  NEGATIVE   Leukocytes, UA NEGATIVE  NEGATIVE  POCT PREGNANCY, URINE     Status: None   Collection Time  10/13/12 10:50 AM      Result Value Range   Preg Test, Ur NEGATIVE  NEGATIVE  CBC WITH DIFFERENTIAL     Status: None   Collection Time    10/13/12 11:25 AM      Result Value Range   WBC 7.1  4.0 - 10.5 K/uL   RBC 4.38  3.87 - 5.11 MIL/uL   Hemoglobin 12.7  12.0 - 15.0 g/dL   HCT 62.1  30.8 - 65.7 %   MCV 88.1  78.0 - 100.0 fL   MCH 29.0  26.0 - 34.0 pg   MCHC 32.9  30.0 - 36.0 g/dL   RDW 84.6  96.2 - 95.2 %   Platelets 224  150 - 400 K/uL   Neutrophils Relative 62  43 - 77 %   Neutro Abs 4.4  1.7 - 7.7 K/uL   Lymphocytes Relative 31  12 - 46 %   Lymphs Abs 2.2  0.7 - 4.0 K/uL   Monocytes Relative 5  3 - 12 %   Monocytes Absolute 0.4  0.1 - 1.0 K/uL   Eosinophils Relative 1  0 - 5 %    Eosinophils Absolute 0.1  0.0 - 0.7 K/uL   Basophils Relative 0  0 - 1 %   Basophils Absolute 0.0  0.0 - 0.1 K/uL  WET PREP, GENITAL     Status: Abnormal   Collection Time    10/13/12 11:34 AM      Result Value Range   Yeast Wet Prep HPF POC NONE SEEN  NONE SEEN   Trich, Wet Prep NONE SEEN  NONE SEEN   Clue Cells Wet Prep HPF POC NONE SEEN  NONE SEEN   WBC, Wet Prep HPF POC MODERATE (*) NONE SEEN   MAU Course  Procedures   GC/CHL culture to lab MDM Discussed lab and physical findings with the patient.  Will refer to [facility] for further evaluation of amenorrhea  Assessment and Plan  A:  Amenorrhea X 2 years      Abdominal pain X 1 year      P: Refer to [facility] for further evaluation   KEY,EVE M 10/13/2012, 12:30 PM

## 2012-10-13 NOTE — MAU Note (Signed)
Pt presents with complaints of not having a menstrual cycle since November. States she had her tubes tied after her last pregnancy.

## 2012-10-15 LAB — GC/CHLAMYDIA PROBE AMP: GC Probe RNA: NEGATIVE

## 2013-11-22 ENCOUNTER — Encounter: Payer: Self-pay | Admitting: Obstetrics & Gynecology

## 2013-11-22 ENCOUNTER — Ambulatory Visit (INDEPENDENT_AMBULATORY_CARE_PROVIDER_SITE_OTHER): Payer: Self-pay | Admitting: Obstetrics & Gynecology

## 2013-11-22 VITALS — BP 108/65 | HR 65 | Temp 98.0°F

## 2013-11-22 DIAGNOSIS — N912 Amenorrhea, unspecified: Secondary | ICD-10-CM

## 2013-11-22 DIAGNOSIS — R87619 Unspecified abnormal cytological findings in specimens from cervix uteri: Secondary | ICD-10-CM

## 2013-11-22 LAB — TSH: TSH: 1.238 u[IU]/mL (ref 0.350–4.500)

## 2013-11-22 MED ORDER — MEDROXYPROGESTERONE ACETATE 10 MG PO TABS: 10.0000 mg | ORAL_TABLET | Freq: Every day | ORAL | Status: DC

## 2013-11-22 NOTE — Progress Notes (Signed)
Pt. Has not has not had a period since October/November 2014. Pt. Also reports having abnormal paps in the past; had a cryo 3 years ago. [facility] and was abnormal-- they wanted her to follow up every 6 months for a repeat pap for total of three. Pt. Was unable to go because her medicaid was taken away. Pt. Also complaint of foul smelling discharge-- wet prep today.

## 2013-11-22 NOTE — Patient Instructions (Signed)
Secondary Amenorrhea   Secondary amenorrhea is the stopping of menstrual flow for 3 6 months in a female who has previously had periods. There are many possible causes. Most of these causes are not serious. Usually, treating the underlying problem causing the loss of menses will return your periods to normal.  CAUSES   Some common and uncommon causes of not menstruating include:   Malnutrition.   Low blood sugar (hypoglycemia).   Polycystic ovary disease.   Stress or fear.   Breastfeeding.   Hormone imbalance.   Ovarian failure.   Medicines.   Extreme obesity.   Cystic fibrosis.   Low body weight or drastic weight reduction from any cause.   Early menopause.   Removal of ovaries or uterus.   Contraceptives.   Illness.   Long-term (chronic) illnesses.   Cushing syndrome.   Thyroid problems.   Birth control pills, patches, or vaginal rings for birth control.  RISK FACTORS  You may be at greater risk of secondary amenorrhea if:   You have a family history of this condition.   You have an eating disorder.   You do athletic training.  DIAGNOSIS   A diagnosis is made by your health care provider taking a medical history and doing a physical exam. This will include a pelvic exam to check for problems with your reproductive organs. Pregnancy must be ruled out. Often, numerous blood tests are done to measure different hormones in the body. Urine testing may be done. Specialized exams (ultrasound, CT scan, MRI, or hysteroscopy) may have to be done as well as measuring the body mass index (BMI).  TREATMENT   Treatment depends on the cause of the amenorrhea. If an eating disorder is present, this can be treated with an adequate diet and therapy. Chronic illnesses may improve with treatment of the illness. Amenorrhea may be corrected with medicines, lifestyle changes, or surgery. If the amenorrhea cannot be corrected, it is sometimes possible to create a false menstruation with medicines.  HOME CARE  INSTRUCTIONS   Maintain a healthy diet.   Manage weight problems.   Exercise regularly but not excessively.   Get adequate sleep.   Manage stress.   Be aware of changes in your menstrual cycle. Keep a record of when your periods occur. Note the date your period starts, how long it lasts, and any problems.  SEEK MEDICAL CARE IF:  Your symptoms do not get better with treatment.  Document Released: 08/08/2006 Document Revised: 02/27/2013 Document Reviewed: 12/13/2012  [employer] Patient Information 2014 [employer]

## 2013-11-22 NOTE — Progress Notes (Signed)
Subjective:     Patient ID: Robin Chapman, female   DOB: 1986-03-16, 28 y.o.   MRN: [id number]  HPI Pt s/p c-section 10/2010.  Had BTL at that time. Pt never had another menses since that time.  She is not worried about the lack of menses but, wants to make sure she does not have further problems   Pt also reports a h/o abnormal PAP's. She had her last PAP 02/2013.  She lost her ins so needs a f/u PAP currently  Review of Systems     Objective:   Physical Exam pt in NAD BP 108/65  Pulse 65  Temp(Src) 98 F (36.7 C)  Abd: soft NT, ND GU: EGBUS: no lesions Vagina: no blood in vault Cervix: no lesion; no mucopurulent d/c; friable Uterus: small, mobile Adnexa: no masses; non tender           Assessment:     Secondary  Amenorrhea- possible Asherman's.  Need to eval for pituitary issues  H/o abnormal PAP    Plan:     Labs: TSH, Prolactin F/u PAP w/ HPV, cx and wet smear Provera 10mg  x 10 days

## 2013-11-23 LAB — PROLACTIN: PROLACTIN: 3.3 ng/mL

## 2013-11-25 ENCOUNTER — Telehealth: Payer: Self-pay | Admitting: *Deleted

## 2013-11-25 NOTE — Telephone Encounter (Signed)
Pt called nurse line requesting results.  Spoke with patient, results for pap unavailable at this time, informed patient to call back this Friday.  Pt verbalizes understanding.

## 2013-11-28 ENCOUNTER — Other Ambulatory Visit: Payer: Self-pay | Admitting: Obstetrics & Gynecology

## 2013-11-28 DIAGNOSIS — N760 Acute vaginitis: Principal | ICD-10-CM

## 2013-11-28 DIAGNOSIS — B9689 Other specified bacterial agents as the cause of diseases classified elsewhere: Secondary | ICD-10-CM

## 2013-11-28 MED ORDER — METRONIDAZOLE 500 MG PO TABS: 500.0000 mg | ORAL_TABLET | Freq: Two times a day (BID) | ORAL | Status: AC

## 2013-12-03 NOTE — Progress Notes (Signed)
Called patient at both mobile and home number, no answer- left message that we are trying to reach you, please give [place] a call back at the clinics

## 2013-12-03 NOTE — Progress Notes (Signed)
Spoke with patient concerning note from Dr. Erin Fulling concerning results and medication.  Pt verbalizes understanding.

## 2013-12-04 ENCOUNTER — Telehealth: Payer: Self-pay

## 2013-12-04 NOTE — Telephone Encounter (Signed)
Message copied by Louanna Raw on Wed Dec 04, 2013 10:44 AM ------      Message from: Willodean Rosenthal      Created: Wed Dec 04, 2013  9:47 AM       Repeat PAP in 1 year.            clh-S      ----- Message -----         From: Louanna Raw, RN         Sent: 12/04/2013   8:45 AM           To: Willodean Rosenthal, MD            Hi Dr. Erin Fulling,             This patient's pap is ASCUS neg HPV. Please advise.             Thank you,       Ladona Ridgel, RN        ------

## 2013-12-04 NOTE — Telephone Encounter (Signed)
Attempted to call patient. NO answer. Left message stating we are calling with results, non-urgent, please call clinic.

## 2013-12-05 NOTE — Telephone Encounter (Signed)
Called patient at mobile number, number invalid. Called patient at home, no answer- left message that we are trying to reach you, please give [place] a call back

## 2013-12-06 NOTE — Telephone Encounter (Signed)
Attempted to call patient via home number-- no answer--- left message stating we are calling with results, please call clinic. Will send letter.

## 2014-01-06 ENCOUNTER — Encounter: Payer: Self-pay | Admitting: General Practice

## 2014-05-12 ENCOUNTER — Encounter: Payer: Self-pay | Admitting: Obstetrics & Gynecology

## 2015-07-31 ENCOUNTER — Inpatient Hospital Stay (HOSPITAL_COMMUNITY)
Admission: AD | Admit: 2015-07-31 | Discharge: 2015-07-31 | Disposition: A | Discharge status: Home / Self Care | Payer: Self-pay | Source: Ambulatory Visit | Attending: Obstetrics and Gynecology | Admitting: Obstetrics and Gynecology

## 2015-07-31 ENCOUNTER — Encounter (HOSPITAL_COMMUNITY): Payer: Self-pay

## 2015-07-31 DIAGNOSIS — M546 Pain in thoracic spine: Secondary | ICD-10-CM | POA: Insufficient documentation

## 2015-07-31 DIAGNOSIS — R102 Pelvic and perineal pain: Secondary | ICD-10-CM | POA: Insufficient documentation

## 2015-07-31 DIAGNOSIS — Z8742 Personal history of other diseases of the female genital tract: Secondary | ICD-10-CM

## 2015-07-31 DIAGNOSIS — N914 Secondary oligomenorrhea: Secondary | ICD-10-CM | POA: Diagnosis present

## 2015-07-31 DIAGNOSIS — N915 Oligomenorrhea, unspecified: Secondary | ICD-10-CM | POA: Insufficient documentation

## 2015-07-31 LAB — CBC WITH DIFFERENTIAL/PLATELET
BASOS ABS: 0 10*3/uL (ref 0.0–0.1)
BASOS PCT: 0 %
EOS ABS: 0.1 10*3/uL (ref 0.0–0.7)
Eosinophils Relative: 1 %
HCT: 37.3 % (ref 36.0–46.0)
HEMOGLOBIN: 12.1 g/dL (ref 12.0–15.0)
Lymphocytes Relative: 29 %
Lymphs Abs: 2.2 10*3/uL (ref 0.7–4.0)
MCH: 28.5 pg (ref 26.0–34.0)
MCHC: 32.4 g/dL (ref 30.0–36.0)
MCV: 87.8 fL (ref 78.0–100.0)
Monocytes Absolute: 0.3 10*3/uL (ref 0.1–1.0)
Monocytes Relative: 4 %
NEUTROS PCT: 66 %
Neutro Abs: 4.9 10*3/uL (ref 1.7–7.7)
PLATELETS: 261 10*3/uL (ref 150–400)
RBC: 4.25 MIL/uL (ref 3.87–5.11)
RDW: 13.4 % (ref 11.5–15.5)
WBC: 7.4 10*3/uL (ref 4.0–10.5)

## 2015-07-31 LAB — URINALYSIS, ROUTINE W REFLEX MICROSCOPIC
Bilirubin Urine: NEGATIVE
GLUCOSE, UA: NEGATIVE mg/dL
HGB URINE DIPSTICK: NEGATIVE
Ketones, ur: NEGATIVE mg/dL
Leukocytes, UA: NEGATIVE
Nitrite: NEGATIVE
PH: 5.5 (ref 5.0–8.0)
PROTEIN: NEGATIVE mg/dL
SPECIFIC GRAVITY, URINE: 1.025 (ref 1.005–1.030)

## 2015-07-31 LAB — WET PREP, GENITAL
CLUE CELLS WET PREP: NONE SEEN
Trich, Wet Prep: NONE SEEN
Yeast Wet Prep HPF POC: NONE SEEN

## 2015-07-31 LAB — TSH: TSH: 0.992 u[IU]/mL (ref 0.350–4.500)

## 2015-07-31 LAB — POCT PREGNANCY, URINE: Preg Test, Ur: NEGATIVE

## 2015-07-31 MED ORDER — CYCLOBENZAPRINE HCL 10 MG PO TABS: 10.0000 mg | ORAL_TABLET | Freq: Three times a day (TID) | ORAL | Status: DC | PRN

## 2015-07-31 MED ORDER — IBUPROFEN 600 MG PO TABS: 600.0000 mg | ORAL_TABLET | Freq: Four times a day (QID) | ORAL | Status: DC | PRN

## 2015-07-31 MED ORDER — KETOROLAC TROMETHAMINE 60 MG/2ML IM SOLN
60.0000 mg | Freq: Once | INTRAMUSCULAR | Status: AC
  Administered 2015-07-31: 60 mg via INTRAMUSCULAR
  Filled 2015-07-31: qty 2

## 2015-07-31 MED ORDER — MEDROXYPROGESTERONE ACETATE 10 MG PO TABS: 10.0000 mg | ORAL_TABLET | Freq: Every day | ORAL | Status: DC

## 2015-07-31 NOTE — MAU Note (Signed)
Patient had tubal ligation after last child 4 years ago, no periods since, having spotting and abdominal pain.

## 2015-07-31 NOTE — MAU Provider Note (Signed)
Chief Complaint:  Vaginal Bleeding and Abdominal Pain   First Provider Initiated Contact with Patient 07/31/15 1005    .  Robin Chapman is a 30 y.o. [phone] who presents to maternity admissions reporting pelvic pain and irregular spotting/abscence of periods for several years, since BTL in 2012.  States pain got worse today. Had a workup by Dr Erin Fulling in 2015 but was never able to be contacted with results, which were normal.  Came in because her mother in law told her she might be pregnant in her tubes. Also c/o mid back pain, between scapula, spasm-like She reports no vaginal bleeding, vaginal itching/burning, urinary symptoms, h/a, dizziness, n/v, or fever/chills.     Abdominal Pain This is a recurrent problem. The current episode started in the past 7 days. The onset quality is gradual. The problem occurs intermittently. The problem has been unchanged. The pain is located in the suprapubic region, LLQ and RLQ. The pain is moderate. The quality of the pain is cramping and sharp. The abdominal pain does not radiate. Pertinent negatives include no constipation, diarrhea, fever, frequency, myalgias, nausea or vomiting. Nothing aggravates the pain. The pain is relieved by nothing. She has tried acetaminophen for the symptoms. The treatment provided no relief.   Past Medical History: Past Medical History  Diagnosis Date  . Abnormal Pap smear of vagina     Past obstetric history: OB History  Gravida Para Term Preterm AB SAB TAB Ectopic Multiple Living  0 0 0 0 0 2    # Outcome Date GA Lbr Len/2nd Weight Sex Delivery Anes PTL Lv  3 Preterm 2012     CS-Unspec     2 Term 2011     CS-Unspec     1 Gravida               Past Surgical History: Past Surgical History  Procedure Laterality Date  . Cesarean section    . Hernia repair    . Dilation and curettage of uterus      Family History: Family History  Problem Relation Age of Onset  . Cancer Maternal Grandmother    breast  . Cancer Maternal Grandfather     skin cancer    Social History: Social History  Substance Use Topics  . Smoking status: Never Smoker   . Smokeless tobacco: Never Used  . Alcohol Use: No    Allergies: No Known Allergies  Meds:  Prescriptions prior to admission  Medication Sig Dispense Refill Last Dose  . acetaminophen (TYLENOL) 500 MG tablet Take 500 mg by mouth every 6 (six) hours as needed for mild pain or headache.   Past Week at Unknown time   I have reviewed patient's Past Medical Hx, Surgical Hx, Family Hx, Social Hx, medications and allergies.   ROS:  Review of Systems  Constitutional: Negative for fever and chills.  Gastrointestinal: Positive for abdominal pain. Negative for nausea, vomiting, diarrhea and constipation.  Genitourinary: Positive for vaginal discharge, menstrual problem (oligomenorrhea) and pelvic pain. Negative for frequency and vaginal bleeding.  Musculoskeletal: Positive for back pain (thoracic area). Negative for myalgias.  Other systems negative   Physical Exam  Patient Vitals for the past 24 hrs:  BP Temp Temp src Pulse  07/31/15 1002 141/75 mmHg 97.9 F (36.6 C) Oral 91   Constitutional: Well-developed, well-nourished female in no acute distress.  Cardiovascular: normal rate and rhythm, no ectopy audible, S1 & S2 heard, no murmur Respiratory: normal effort, no  distress. Lungs CTAB with no wheezes or crackles GI: Abd soft, non-tender.  Nondistended.  No rebound, No guarding.  Bowel Sounds audible  MS: Extremities nontender, no edema, normal ROM Neurologic: Alert and oriented x 4.   Grossly nonfocal. GU: Neg CVAT. Skin:  Warm and Dry Psych:  Affect appropriate.  PELVIC EXAM: Cervix pink, visually closed, without lesion, scant white creamy discharge, vaginal walls and external genitalia normal Bimanual exam: Cervix firm, posterior, neg CMT, uterus nontender, nonenlarged, adnexa with some tenderness, no enlargement, or mass    Labs:     Results for orders placed or performed during the hospital encounter of 07/31/15 (from the past 24 hour(s))  Urinalysis, Routine w reflex microscopic (not at [facility])     Status: None   Collection Time: 07/31/15 10:00 AM  Result Value Ref Range   Color, Urine YELLOW YELLOW   APPearance CLEAR CLEAR   Specific Gravity, Urine 1.025 1.005 - 1.030   pH 5.5 5.0 - 8.0   Glucose, UA NEGATIVE NEGATIVE mg/dL   Hgb urine dipstick NEGATIVE NEGATIVE   Bilirubin Urine NEGATIVE NEGATIVE   Ketones, ur NEGATIVE NEGATIVE mg/dL   Protein, ur NEGATIVE NEGATIVE mg/dL   Nitrite NEGATIVE NEGATIVE   Leukocytes, UA NEGATIVE NEGATIVE  Pregnancy, urine POC     Status: None   Collection Time: 07/31/15 10:07 AM  Result Value Ref Range   Preg Test, Ur NEGATIVE NEGATIVE  Wet prep, genital     Status: Abnormal   Collection Time: 07/31/15 10:10 AM  Result Value Ref Range   Yeast Wet Prep HPF POC NONE SEEN NONE SEEN   Trich, Wet Prep NONE SEEN NONE SEEN   Clue Cells Wet Prep HPF POC NONE SEEN NONE SEEN   WBC, Wet Prep HPF POC FEW (A) NONE SEEN   Sperm PRESENT   CBC with Differential     Status: None (Preliminary result)   Collection Time: 07/31/15 10:40 AM  Result Value Ref Range   WBC 7.4 4.0 - 10.5 K/uL   RBC 4.25 3.87 - 5.11 MIL/uL   Hemoglobin 12.1 12.0 - 15.0 g/dL   HCT 16.1 09.6 - 04.5 %   MCV 87.8 78.0 - 100.0 fL   MCH 28.5 26.0 - 34.0 pg   MCHC 32.4 30.0 - 36.0 g/dL   RDW 40.9 81.1 - 91.4 %   Platelets 261 150 - 400 K/uL   Other PENDING %   Neutrophils Relative % 66 %   Neutro Abs 4.9 1.7 - 7.7 K/uL   Lymphocytes Relative 29 %   Lymphs Abs 2.2 0.7 - 4.0 K/uL   Monocytes Relative 4 %   Monocytes Absolute 0.3 0.1 - 1.0 K/uL   Eosinophils Relative 1 %   Eosinophils Absolute 0.1 0.0 - 0.7 K/uL   Basophils Relative 0 %   Basophils Absolute 0.0 0.0 - 0.1 K/uL     Imaging:  No results found.  MAU Course/MDM: I have ordered labs as follows:  CBC to rule out severe infections like appendicitis,  UA to rule out cystitis, and cultures to rule out STD/PID Imaging ordered: Out patient [place] will be ordered Results reviewed.   Toradol given here with excellent relief of pain.    Pt stable at time of discharge.  Assessment: Pelvic pain, chronic with exacerbation Oligomenorrhea Thoracic back pain, probably spasm.  Plan: Discharge home Recommend GYN follow up.  Will make appt Rx sent for Provera for Provera challenge Will order pelvic US as outpatient Rx Motrin for pelvic  cramping Rx Flexeril for back spasm, warned about no driving       Medication List    ASK your doctor about these medications        acetaminophen 500 MG tablet  Commonly known as:  TYLENOL  Take 500 mg by mouth every 6 (six) hours as needed for mild pain or headache.       Encouraged to return here or to other Urgent Care/ED if she develops worsening of symptoms, increase in pain, fever, or other concerning symptoms.   Wynelle Bourgeois CNM, MSN Certified Nurse-Midwife 07/31/2015 10:36 AM

## 2015-07-31 NOTE — Discharge Instructions (Signed)
Dysfunctional Uterine Bleeding Dysfunctional uterine bleeding is abnormal bleeding from the uterus. Dysfunctional uterine bleeding includes:  A period that comes earlier or later than usual.  A period that is lighter, heavier, or has blood clots.  Bleeding between periods. Back Exercises If you have pain in your back, do these exercises 2-3 times each day or as told by your doctor. When the pain goes away, do the exercises once each day, but repeat the steps more times for each exercise (do more repetitions). If you do not have pain in your back, do these exercises once each day or as told by your doctor. EXERCISES Single Knee to Chest Do these steps 3-5 times in a row for each leg: Lie on your back on a firm bed or the floor with your legs stretched out. Bring one knee to your chest. Hold your knee to your chest by grabbing your knee or thigh. Pull on your knee until you feel a gentle stretch in your lower back. Keep doing the stretch for 10-30 seconds. Slowly let go of your leg and straighten it. Pelvic Tilt Do these steps 5-10 times in a row: Lie on your back on a firm bed or the floor with your legs stretched out. Bend your knees so they point up to the ceiling. Your feet should be flat on the floor. Tighten your lower belly (abdomen) muscles to press your lower back against the floor. This will make your tailbone point up to the ceiling instead of pointing down to your feet or the floor. Stay in this position for 5-10 seconds while you gently tighten your muscles and breathe evenly. Cat-Cow Do these steps until your lower back bends more easily: Get on your hands and knees on a firm surface. Keep your hands under your shoulders, and keep your knees under your hips. You may put padding under your knees. Let your head hang down, and make your tailbone point down to the floor so your lower back is round like the back of a cat. Stay in this position for 5 seconds. Slowly lift your head  and make your tailbone point up to the ceiling so your back hangs low (sags) like the back of a cow. Stay in this position for 5 seconds. Press-Ups Do these steps 5-10 times in a row: Lie on your belly (face-down) on the floor. Place your hands near your head, about shoulder-width apart. While you keep your back relaxed and keep your hips on the floor, slowly straighten your arms to raise the top half of your body and lift your shoulders. Do not use your back muscles. To make yourself more comfortable, you may change where you place your hands. Stay in this position for 5 seconds. Slowly return to lying flat on the floor. Bridges Do these steps 10 times in a row: Lie on your back on a firm surface. Bend your knees so they point up to the ceiling. Your feet should be flat on the floor. Tighten your butt muscles and lift your butt off of the floor until your waist is almost as high as your knees. If you do not feel the muscles working in your butt and the back of your thighs, slide your feet 1-2 inches farther away from your butt. Stay in this position for 3-5 seconds. Slowly lower your butt to the floor, and let your butt muscles relax. If this exercise is too easy, try doing it with your arms crossed over your chest. Belly Crunches Do  these steps 5-10 times in a row: Lie on your back on a firm bed or the floor with your legs stretched out. Bend your knees so they point up to the ceiling. Your feet should be flat on the floor. Cross your arms over your chest. Tip your chin a little bit toward your chest but do not bend your neck. Tighten your belly muscles and slowly raise your chest just enough to lift your shoulder blades a tiny bit off of the floor. Slowly lower your chest and your head to the floor. Back Lifts Do these steps 5-10 times in a row: Lie on your belly (face-down) with your arms at your sides, and rest your forehead on the floor. Tighten the muscles in your legs and your  butt. Slowly lift your chest off of the floor while you keep your hips on the floor. Keep the back of your head in line with the curve in your back. Look at the floor while you do this. Stay in this position for 3-5 seconds. Slowly lower your chest and your face to the floor. GET HELP IF: Your back pain gets a lot worse when you do an exercise. Your back pain does not lessen 2 hours after you exercise. If you have any of these problems, stop doing the exercises. Do not do them again unless your doctor says it is okay. GET HELP RIGHT AWAY IF: You have sudden, very bad back pain. If this happens, stop doing the exercises. Do not do them again unless your doctor says it is okay.   This information is not intended to replace advice given to you by your health care provider. Make sure you discuss any questions you have with your health care provider.   Document Released: 07/30/2010 Document Revised: 03/18/2015 Document Reviewed: 08/21/2014 Elsevier Interactive Patient Education [employer]   Skipping one or more periods.  Bleeding after sexual intercourse.  Bleeding after menopause. HOME CARE INSTRUCTIONS  Pay attention to any changes in your symptoms. Follow these instructions to help with your condition: Eating  Eat well-balanced meals. Include foods that are high in iron, such as liver, meat, shellfish, green leafy vegetables, and eggs.  If you become constipated:  Drink plenty of water.  Eat fruits and vegetables that are high in water and fiber, such as spinach, carrots, raspberries, apples, and mango. Medicines  Take over-the-counter and prescription medicines only as told by your health care provider.  Do not change medicines without talking with your health care provider.  Aspirin or medicines that contain aspirin may make the bleeding worse. Do not take those medicines:  During the week before your period.  During your period.  If you were prescribed iron  pills, take them as told by your health care provider. Iron pills help to replace iron that your body loses because of this condition. Activity  If you need to change your sanitary pad or tampon more than one time every 2 hours:  Lie in bed with your feet raised (elevated).  Place a cold pack on your lower abdomen.  Rest as much as possible until the bleeding stops or slows down.  Do not try to lose weight until the bleeding has stopped and your blood iron level is back to normal. Other Instructions  For two months, write down:  When your period starts.  When your period ends.  When any abnormal bleeding occurs.  What problems you notice.  Keep all follow up visits as told by  your health care provider. This is important. SEEK MEDICAL CARE IF:  You get light-headed or weak.  You have nausea and vomiting.  You cannot eat or drink without vomiting.  You feel dizzy or have diarrhea while you are taking medicines.  You are taking birth control pills or hormones, and you want to change them or stop taking them. SEEK IMMEDIATE MEDICAL CARE IF:  You develop a fever or chills.  You need to change your sanitary pad or tampon more than one time per hour.  Your bleeding becomes heavier, or your flow contains clots more often.  You develop pain in your abdomen.  You lose consciousness.  You develop a rash.   This information is not intended to replace advice given to you by your health care provider. Make sure you discuss any questions you have with your health care provider.   Document Released: 06/24/2000 Document Revised: 03/18/2015 Document Reviewed: 09/22/2014 Elsevier Interactive Patient Education 2016 [employer] Pelvic Pain, Female Female pelvic pain can be caused by many different things and start from a variety of places. Pelvic pain refers to pain that is located in the lower half of the abdomen and between your hips. The pain may occur over a short period of  time (acute) or may be reoccurring (chronic). The cause of pelvic pain may be related to disorders affecting the female reproductive organs (gynecologic), but it may also be related to the bladder, kidney stones, an intestinal complication, or muscle or skeletal problems. Getting help right away for pelvic pain is important, especially if there has been severe, sharp, or a sudden onset of unusual pain. It is also important to get help right away because some types of pelvic pain can be life threatening.  CAUSES  Below are only some of the causes of pelvic pain. The causes of pelvic pain can be in one of several categories.   Gynecologic.  Pelvic inflammatory disease.  Sexually transmitted infection.  Ovarian cyst or a twisted ovarian ligament (ovarian torsion).  Uterine lining that grows outside the uterus (endometriosis).  Fibroids, cysts, or tumors.  Ovulation.  Pregnancy.  Pregnancy that occurs outside the uterus (ectopic pregnancy).  Miscarriage.  Labor.  Abruption of the placenta or ruptured uterus.  Infection.  Uterine infection (endometritis).  Bladder infection.  Diverticulitis.  Miscarriage related to a uterine infection (septic abortion).  Bladder.  Inflammation of the bladder (cystitis).  Kidney stone(s).  Gastrointestinal.  Constipation.  Diverticulitis.  Neurologic.  Trauma.  Feeling pelvic pain because of mental or emotional causes (psychosomatic).  Cancers of the bowel or pelvis. EVALUATION  Your caregiver will want to take a careful history of your concerns. This includes recent changes in your health, a careful gynecologic history of your periods (menses), and a sexual history. Obtaining your family history and medical history is also important. Your caregiver may suggest a pelvic exam. A pelvic exam will help identify the location and severity of the pain. It also helps in the evaluation of which organ system may be involved. In order to  identify the cause of the pelvic pain and be properly treated, your caregiver may order tests. These tests may include:   A pregnancy test.  Pelvic ultrasonography.  An X-ray exam of the abdomen.  A urinalysis or evaluation of vaginal discharge.  Blood tests. HOME CARE INSTRUCTIONS   Only take over-the-counter or prescription medicines for pain, discomfort, or fever as directed by your caregiver.   Rest as directed by your caregiver.  Eat a balanced diet.   Drink enough fluids to make your urine clear or pale yellow, or as directed.   Avoid sexual intercourse if it causes pain.   Apply warm or cold compresses to the lower abdomen depending on which one helps the pain.   Avoid stressful situations.   Keep a journal of your pelvic pain. Write down when it started, where the pain is located, and if there are things that seem to be associated with the pain, such as food or your menstrual cycle.  Follow up with your caregiver as directed.  SEEK MEDICAL CARE IF:  Your medicine does not help your pain.  You have abnormal vaginal discharge. SEEK IMMEDIATE MEDICAL CARE IF:   You have heavy bleeding from the vagina.   Your pelvic pain increases.   You feel light-headed or faint.   You have chills.   You have pain with urination or blood in your urine.   You have uncontrolled diarrhea or vomiting.   You have a fever or persistent symptoms for more than 3 days.  You have a fever and your symptoms suddenly get worse.   You are being physically or sexually abused.   This information is not intended to replace advice given to you by your health care provider. Make sure you discuss any questions you have with your health care provider.   Document Released: 05/24/2004 Document Revised: 03/18/2015 Document Reviewed: 10/17/2011 Elsevier Interactive Patient Education [employer]

## 2015-08-01 LAB — FOLLICLE STIMULATING HORMONE: FSH: 4.5 m[IU]/mL

## 2015-08-01 LAB — PROLACTIN: Prolactin: 5.9 ng/mL (ref 4.8–23.3)

## 2015-08-03 LAB — GC/CHLAMYDIA PROBE AMP (~~LOC~~) NOT AT ARMC
Chlamydia: NEGATIVE
NEISSERIA GONORRHEA: NEGATIVE

## 2015-08-04 ENCOUNTER — Other Ambulatory Visit: Payer: Self-pay | Admitting: Advanced Practice Midwife

## 2015-08-04 DIAGNOSIS — R102 Pelvic and perineal pain: Secondary | ICD-10-CM

## 2015-08-06 ENCOUNTER — Ambulatory Visit (HOSPITAL_COMMUNITY)
Admission: RE | Admit: 2015-08-06 | Discharge: 2015-08-06 | Disposition: A | Discharge status: Home / Self Care | Payer: Self-pay | Source: Ambulatory Visit | Attending: Advanced Practice Midwife | Admitting: Advanced Practice Midwife

## 2015-08-06 DIAGNOSIS — R102 Pelvic and perineal pain: Secondary | ICD-10-CM | POA: Insufficient documentation

## 2015-08-06 DIAGNOSIS — N915 Oligomenorrhea, unspecified: Secondary | ICD-10-CM | POA: Insufficient documentation

## 2015-08-06 DIAGNOSIS — N914 Secondary oligomenorrhea: Secondary | ICD-10-CM

## 2015-08-06 DIAGNOSIS — Z9851 Tubal ligation status: Secondary | ICD-10-CM | POA: Insufficient documentation

## 2015-08-10 ENCOUNTER — Telehealth: Payer: Self-pay

## 2015-08-10 NOTE — Telephone Encounter (Signed)
Pt called for results of [place].  Called pt and went straight to VM.  LM to return call to the Clinics.

## 2015-08-11 NOTE — Telephone Encounter (Signed)
Patient called into front office requesting ultrasound results and returning our missed call. Informed patient of negative ultrasound. Patient verbalized understanding & asked about lab work. Told patient that it appears her lab work is normal as well. Patient verbalized understanding & had no other questions

## 2015-08-27 ENCOUNTER — Encounter: Payer: Self-pay | Admitting: Obstetrics & Gynecology

## 2015-08-28 ENCOUNTER — Emergency Department (INDEPENDENT_AMBULATORY_CARE_PROVIDER_SITE_OTHER)
Admission: EM | Admit: 2015-08-28 | Discharge: 2015-08-28 | Disposition: A | Discharge status: Home / Self Care | Payer: Self-pay | Source: Home / Self Care | Attending: Family Medicine | Admitting: Family Medicine

## 2015-08-28 ENCOUNTER — Encounter (HOSPITAL_COMMUNITY): Payer: Self-pay

## 2015-08-28 ENCOUNTER — Other Ambulatory Visit (HOSPITAL_COMMUNITY)
Admission: RE | Admit: 2015-08-28 | Discharge: 2015-08-28 | Disposition: A | Discharge status: Home / Self Care | Payer: Self-pay | Source: Ambulatory Visit | Attending: Family Medicine | Admitting: Family Medicine

## 2015-08-28 DIAGNOSIS — J040 Acute laryngitis: Secondary | ICD-10-CM

## 2015-08-28 LAB — POCT RAPID STREP A: STREPTOCOCCUS, GROUP A SCREEN (DIRECT): NEGATIVE

## 2015-08-28 MED ORDER — AMOXICILLIN 500 MG PO CAPS: 500.0000 mg | ORAL_CAPSULE | Freq: Three times a day (TID) | ORAL | Status: DC

## 2015-08-28 NOTE — ED Provider Notes (Signed)
CSN: [id number]     Arrival date & time 08/28/15  1619 History   First MD Initiated Contact with Patient 08/28/15 1736     Chief Complaint  Patient presents with  . Sore Throat   (Consider location/radiation/quality/duration/timing/severity/associated sxs/prior Treatment) HPI Sore throat for the last several days. No fever. No nausea vomiting pain score is about 4. Over-the-counter medicines are not helping. She has 2 children that have a viral illnesses. Past Medical History  Diagnosis Date  . Abnormal Pap smear of vagina    Past Surgical History  Procedure Laterality Date  . Cesarean section    . Hernia repair    . Dilation and curettage of uterus     Family History  Problem Relation Age of Onset  . Cancer Maternal Grandmother     breast  . Cancer Maternal Grandfather     skin cancer   Social History  Substance Use Topics  . Smoking status: Never Smoker   . Smokeless tobacco: Never Used  . Alcohol Use: No   OB History    Gravida Para Term Preterm AB TAB SAB Ectopic Multiple Living   0 0 0 0 0 2     Review of Systems See history of present illness Allergies  Review of patient's allergies indicates no known allergies.  Home Medications   Prior to Admission medications   Medication Sig Start Date End Date Taking? Authorizing Provider  acetaminophen (TYLENOL) 500 MG tablet Take 500 mg by mouth every 6 (six) hours as needed for mild pain or headache.    Historical Provider, MD  amoxicillin (AMOXIL) 500 MG capsule Take 1 capsule (500 mg total) by mouth 3 (three) times daily. 08/28/15   Tharon Aquas, PA  cyclobenzaprine (FLEXERIL) 10 MG tablet Take 1 tablet (10 mg total) by mouth 3 (three) times daily as needed for muscle spasms. 07/31/15   Aviva Signs, CNM  ibuprofen (ADVIL,MOTRIN) 600 MG tablet Take 1 tablet (600 mg total) by mouth every 6 (six) hours as needed. 07/31/15   Aviva Signs, CNM  medroxyPROGESTERone (PROVERA) 10 MG tablet Take 1 tablet (10  mg total) by mouth daily. Use for ten days 07/31/15   Aviva Signs, CNM   Meds Ordered and Administered this Visit  Medications - No data to display  BP 123/74 mmHg  Pulse 80  Temp(Src) 98.1 F (36.7 C) (Oral)  Resp 14  SpO2 100%  LMP 08/16/2015 (Exact Date) No data found.   Physical Exam  Constitutional: She is oriented to person, place, and time. She appears well-developed and well-nourished. No distress.  HENT:  Head: Normocephalic and atraumatic.  Right Ear: External ear normal.  Nose: Nose normal.  Mouth/Throat: Oropharynx is clear and moist. No oropharyngeal exudate.  Cardiovascular: Normal rate.   Pulmonary/Chest: Effort normal and breath sounds normal.  Abdominal: Soft. Bowel sounds are normal.  Neurological: She is alert and oriented to person, place, and time.  Skin: Skin is warm and dry.  Psychiatric: She has a normal mood and affect. Her behavior is normal.    ED Course  Procedures (including critical care time)  Labs Review Labs Reviewed  POCT RAPID STREP A    Imaging Review No results found.   Visual Acuity Review  Right Eye Distance:   Left Eye Distance:   Bilateral Distance:    Right Eye Near:   Left Eye Near:    Bilateral Near:        Rapid strep  screen is negative MDM   1. Laryngitis, acute   Patient is advised to continue home symptomatic treatment. Prescription for amoxicillin  sent pharmacy patient has indicated. Patient is advised that if there are new or worsening symptoms or attend the emergency department, or contact primary care provider. Instructions of care provided discharged home in stable condition. Return to work/school note provided.  THIS NOTE WAS GENERATED USING A VOICE RECOGNITION SOFTWARE PROGRAM. ALL REASONABLE EFFORTS  WERE MADE TO PROOFREAD THIS DOCUMENT FOR ACCURACY.     Tharon Aquas, PA 08/28/15 2012

## 2015-08-28 NOTE — ED Notes (Signed)
Patient has sore itchy throat No acute distress

## 2015-08-28 NOTE — Discharge Instructions (Signed)
Laryngitis °Laryngitis is swelling (inflammation) of your vocal cords. This causes hoarseness, coughing, loss of voice, sore throat, or a dry throat. When your vocal cords are inflamed, your voice sounds different. °Laryngitis can be temporary (acute) or long-term (chronic). Most cases of acute laryngitis improve with time. Chronic laryngitis is laryngitis that lasts for more than three weeks. °HOME CARE °· Drink enough fluid to keep your pee (urine) clear or pale yellow. °· Breathe in moist air. Use a humidifier if you live in a dry climate. °· Take medicines only as told by your doctor. °· Do not smoke cigarettes or electronic cigarettes. If you need help quitting, ask your doctor. °· Talk as little as possible. Also avoid whispering, which can cause vocal strain. °· Write instead of talking. Do this until your voice is back to normal. °GET HELP IF: °· You have a fever. °· Your pain is worse. °· You have trouble swallowing. °GET HELP RIGHT AWAY IF: °· You cough up blood. °· You have trouble breathing. °  °This information is not intended to replace advice given to you by your health care provider. Make sure you discuss any questions you have with your health care provider. °  °Document Released: 06/16/2011 Document Revised: 07/18/2014 Document Reviewed: 12/10/2013 °Elsevier Interactive Patient Education ©2016 Elsevier Inc. ° °Upper Respiratory Infection, Adult °Most upper respiratory infections (URIs) are caused by a virus. A URI affects the nose, throat, and upper air passages. The most common type of URI is often called "the common cold." °HOME CARE  °· Take medicines only as told by your doctor. °· Gargle warm saltwater or take cough drops to comfort your throat as told by your doctor. °· Use a warm mist humidifier or inhale steam from a shower to increase air moisture. This may make it easier to breathe. °· Drink enough fluid to keep your pee (urine) clear or pale yellow. °· Eat soups and other clear  broths. °· Have a healthy diet. °· Rest as needed. °· Go back to work when your fever is gone or your doctor says it is okay. °¨ You may need to stay home longer to avoid giving your URI to others. °¨ You can also wear a face mask and wash your hands often to prevent spread of the virus. °· Use your inhaler more if you have asthma. °· Do not use any tobacco products, including cigarettes, chewing tobacco, or electronic cigarettes. If you need help quitting, ask your doctor. °GET HELP IF: °· You are getting worse, not better. °· Your symptoms are not helped by medicine. °· You have chills. °· You are getting more short of breath. °· You have brown or red mucus. °· You have yellow or brown discharge from your nose. °· You have pain in your face, especially when you bend forward. °· You have a fever. °· You have puffy (swollen) neck glands. °· You have pain while swallowing. °· You have white areas in the back of your throat. °GET HELP RIGHT AWAY IF:  °· You have very bad or constant: °¨ Headache. °¨ Ear pain. °¨ Pain in your forehead, behind your eyes, and over your cheekbones (sinus pain). °¨ Chest pain. °· You have long-lasting (chronic) lung disease and any of the following: °¨ Wheezing. °¨ Long-lasting cough. °¨ Coughing up blood. °¨ A change in your usual mucus. °· You have a stiff neck. °· You have changes in your: °¨ Vision. °¨ Hearing. °¨ Thinking. °¨ Mood. °MAKE SURE YOU:  °·   Understand these instructions.  Will watch your condition.  Will get help right away if you are not doing well or get worse.   This information is not intended to replace advice given to you by your health care provider. Make sure you discuss any questions you have with your health care provider.   Document Released: 12/14/2007 Document Revised: 11/11/2014 Document Reviewed: 10/02/2013 Elsevier Interactive Patient Education [employer]2016 Elsevier Inc.

## 2015-08-31 LAB — CULTURE, GROUP A STREP (THRC)

## 2015-09-16 ENCOUNTER — Encounter (HOSPITAL_COMMUNITY): Payer: Self-pay

## 2015-09-16 ENCOUNTER — Emergency Department (HOSPITAL_COMMUNITY)
Admission: EM | Admit: 2015-09-16 | Discharge: 2015-09-16 | Disposition: A | Discharge status: Home / Self Care | Payer: Self-pay | Attending: Emergency Medicine | Admitting: Emergency Medicine

## 2015-09-16 DIAGNOSIS — W01198A Fall on same level from slipping, tripping and stumbling with subsequent striking against other object, initial encounter: Secondary | ICD-10-CM | POA: Insufficient documentation

## 2015-09-16 DIAGNOSIS — Y998 Other external cause status: Secondary | ICD-10-CM | POA: Insufficient documentation

## 2015-09-16 DIAGNOSIS — S0990XA Unspecified injury of head, initial encounter: Secondary | ICD-10-CM | POA: Insufficient documentation

## 2015-09-16 DIAGNOSIS — Y9289 Other specified places as the place of occurrence of the external cause: Secondary | ICD-10-CM | POA: Insufficient documentation

## 2015-09-16 DIAGNOSIS — Y9389 Activity, other specified: Secondary | ICD-10-CM | POA: Insufficient documentation

## 2015-09-16 NOTE — ED Notes (Signed)
Bed: WA17 Expected date:  Expected time:  Means of arrival:  Comments: hold 

## 2015-09-16 NOTE — ED Notes (Signed)
Pt fell last night by slipping in water.  Pt struck head on counter in kitchen.  NO LOC.  Pt here today with headache and dizziness continued

## 2015-09-16 NOTE — ED Notes (Signed)
Pt not found in lobby 

## 2017-01-13 ENCOUNTER — Ambulatory Visit (INDEPENDENT_AMBULATORY_CARE_PROVIDER_SITE_OTHER): Payer: Managed Care, Other (non HMO) | Admitting: Podiatry

## 2017-01-13 ENCOUNTER — Ambulatory Visit (INDEPENDENT_AMBULATORY_CARE_PROVIDER_SITE_OTHER): Payer: Managed Care, Other (non HMO)

## 2017-01-13 ENCOUNTER — Encounter: Payer: Self-pay | Admitting: Podiatry

## 2017-01-13 VITALS — BP 122/81 | HR 76 | Resp 16 | Ht 60.0 in | Wt 190.0 lb

## 2017-01-13 DIAGNOSIS — M21619 Bunion of unspecified foot: Secondary | ICD-10-CM

## 2017-01-13 NOTE — Progress Notes (Signed)
   Subjective:    Patient ID: Robin Chapman, female    DOB: October 24, 1985, 31 y.o.   MRN: [id number]  HPI Chief Complaint  Patient presents with  . Foot Pain    Right foot; Bunion; pt stated, "Hurts to wear high heels; have to buy wider shoes"  . Surgery Consultation    Wants to discuss today      Review of Systems  Musculoskeletal: Positive for back pain.  All other systems reviewed and are negative.      Objective:   Physical Exam        Assessment & Plan:

## 2017-01-13 NOTE — Patient Instructions (Addendum)
Bunion A bunion is a bump on the base of the big toe that forms when the bones of the big toe joint move out of position. Bunions may be small at first, but they often get larger over time. The can make walking painful. What are the causes? A bunion may be caused by:  Wearing narrow or pointed shoes that force the big toe to press against the other toes.  Abnormal foot development that causes the foot to roll inward (pronate).  Changes in the foot that are caused by certain diseases, such as rheumatoid arthritis and polio.  A foot injury.  What increases the risk? The following factors may make you more likely to develop this condition:  Wearing shoes that squeeze the toes together.  Having certain diseases, such as: ? Rheumatoid arthritis. ? Polio. ? Cerebral palsy.  Having family members who have bunions.  Being born with a foot deformity, such as flat feet or low arches.  Doing activities that put a lot of pressure on the feet, such as ballet dancing.  What are the signs or symptoms? The main symptom of a bunion is a noticeable bump on the big toe. Other symptoms may include:  Pain.  Swelling around the big toe.  Redness and inflammation.  Thick or hardened skin on the big toe or between the toes.  Stiffness or loss of motion in the big toe.  Trouble with walking.  How is this diagnosed? A bunion may be diagnosed based on your symptoms, medical history, and activities. You may have tests, such as:  X-rays. These allow your health care provider to check the position of the bones in your foot and look for damage to your joint. They also help your health care provider to determine the severity of your bunion and the best way to treat it.  Joint aspiration. In this test, a sample of fluid is removed from the toe joint. This test, which may be done if you are in a lot of pain, helps to rule out diseases that cause painful swelling of the joints, such as  arthritis.  How is this treated? There is no cure for a bunion, but treatment can help to prevent a bunion from getting worse. Treatment depends on the severity of your symptoms. Your health care provider may recommend:  Wearing shoes that have a wide toe box.  Using bunion pads to cushion the affected area.  Taping your toes together to keep them in a normal position.  Placing a device inside your shoe (orthotics) to help reduce pressure on your toe joint.  Taking medicine to ease pain, inflammation, and swelling.  Applying heat or ice to the affected area.  Doing stretching exercises.  Surgery to remove scar tissue and move the toes back into their normal position. This treatment is rare.  Follow these instructions at home:  Support your toe joint with proper footwear, shoe padding, or taping as told by your health care provider.  Take over-the-counter and prescription medicines only as told by your health care provider.  If directed, apply ice to the injured area: ? Put ice in a plastic bag. ? Place a towel between your skin and the bag. ? Leave the ice on for 20 minutes, 2-3 times per day.  If directed, apply heat to the affected area before you exercise. Use the heat source that your health care provider recommends, such as a moist heat pack or a heating pad. ? Place a towel between your   skin and the heat source. ? Leave the heat on for 20-30 minutes. ? Remove the heat if your skin turns bright red. This is especially important if you are unable to feel pain, heat, or cold. You may have a greater risk of getting burned.  Do exercises as told by your health care provider.  Keep all follow-up visits as told by your health care provider. Contact a health care provider if:  Your symptoms get worse.  Your symptoms do not improve in 2 weeks. Get help right away if:  You have severe pain and trouble with walking. This information is not intended to replace advice given  to you by your health care provider. Make sure you discuss any questions you have with your health care provider. Document Released: 06/27/2005 Document Revised: 12/03/2015 Document Reviewed: 01/25/2015 Elsevier Interactive Patient Education  2018 Elsevier Inc.   Pre-Operative Instructions  Congratulations, you have decided to take an important step to improving your quality of life.  You can be assured that the doctors of [facility] will be with you every step of the way.  1. Plan to be at the surgery center/hospital at least 1 (one) hour prior to your scheduled time unless otherwise directed by the surgical center/hospital staff.  You must have a responsible adult accompany you, remain during the surgery and drive you home.  Make sure you have directions to the surgical center/hospital and know how to get there on time. 2. For hospital based surgery you will need to obtain a history and physical form from your family physician within 1 month prior to the date of surgery- we will give you a form for you primary physician.  3. We make every effort to accommodate the date you request for surgery.  There are however, times where surgery dates or times have to be moved.  We will contact you as soon as possible if a change in schedule is required.   4. No Aspirin/Ibuprofen for one week before surgery.  If you are on aspirin, any non-steroidal anti-inflammatory medications (Mobic, Aleve, Ibuprofen) you should stop taking it 7 days prior to your surgery.  You make take Tylenol  For pain prior to surgery.  5. Medications- If you are taking daily heart and blood pressure medications, seizure, reflux, allergy, asthma, anxiety, pain or diabetes medications, make sure the surgery center/hospital is aware before the day of surgery so they may notify you which medications to take or avoid the day of surgery. 6. No food or drink after midnight the night before surgery unless directed otherwise by surgical  center/hospital staff. 7. No alcoholic beverages 24 hours prior to surgery.  No smoking 24 hours prior to or 24 hours after surgery. 8. Wear loose pants or shorts- loose enough to fit over bandages, boots, and casts. 9. No slip on shoes, sneakers are best. 10. Bring your boot with you to the surgery center/hospital.  Also bring crutches or a walker if your physician has prescribed it for you.  If you do not have this equipment, it will be provided for you after surgery. 11. If you have not been contracted by the surgery center/hospital by the day before your surgery, call to confirm the date and time of your surgery. 12. Leave-time from work may vary depending on the type of surgery you have.  Appropriate arrangements should be made prior to surgery with your employer. 13. Prescriptions will be provided immediately following surgery by your doctor.  Have these filled   as soon as possible after surgery and take the medication as directed. 14. Remove nail polish on the operative foot. 15. Wash the night before surgery.  The night before surgery wash the foot and leg well with the antibacterial soap provided and water paying special attention to beneath the toenails and in between the toes.  Rinse thoroughly with water and dry well with a towel.  Perform this wash unless told not to do so by your physician.  Enclosed: 1 Ice pack (please put in freezer the night before surgery)   1 Hibiclens skin cleaner   Pre-op Instructions  If you have any questions regarding the instructions, do not hesitate to call our office.  [place]: [address] [place], [place] [address] [phone]  [place]: [address] [place], [place] [address] [phone]  [place]: [address]  [place], [place] [address] [phone]   Dr. Norman Regal DPM, Dr. Matthew Wagoner DPM, Dr. M. Todd Hyatt DPM, Dr. Titorya Stover DPM  

## 2017-01-16 NOTE — Progress Notes (Signed)
Subjective:    Patient ID: Robin HellingBrittany D Briley Chapman, female   DOB: 31 y.o.   MRN: [id number]   HPI patient presents with painful bunion deformity right and states that she's had by wider shoes that are still uncomfortable she's tried soaking it and anti-inflammatories without relief and knows that she needs to have this fixed with significant family history of this problem    Review of Systems  All other systems reviewed and are negative.       Objective:  Physical Exam  Cardiovascular: Intact distal pulses.   Musculoskeletal: Normal range of motion.  Neurological: She is alert.  Skin: Skin is warm.  Nursing note and vitals reviewed.  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 She is known that she's needed surgery and she wants to review this today and states she does not want to have another office visit and have to be out of work and pain another co-pay. At this point I allowed her to read consent form for Austin-type osteotomy of the right first metatarsal and possible Akin osteotomy right allowing her to go over alternative treatments complications as listed and the fact that there is no long-term guarantees as far as success and we may not be able to achieve complete radiographic correction of the deformity. Patient understands all this and is willing to accept risk wants surgery and signs consent form understanding total recovery can take 6 months to one year. I then went ahead and dispensed air fracture walker with all instructions on usage and she will get  used to it prior to surgery and patient will have this performed on an outpatient basis  X-rays indicate elevation of the intermetatarsal angle of approximate 15 with deviation the hallux against the second toe

## 2017-02-01 ENCOUNTER — Telehealth: Payer: Self-pay | Admitting: *Deleted

## 2017-02-01 NOTE — Telephone Encounter (Signed)
I left a message for the patient to give me a call.  I need to inform her that her insurance is for dental only.  I need to know if she has other insurance.

## 2017-02-13 NOTE — Telephone Encounter (Signed)
I attempted to call patient to see if she still intended to have surgery tomorrow.  If so, we need a different insurance card.

## 2017-02-14 ENCOUNTER — Encounter: Payer: Self-pay | Admitting: Podiatry

## 2017-02-14 ENCOUNTER — Telehealth: Payer: Self-pay | Admitting: *Deleted

## 2017-02-14 NOTE — Telephone Encounter (Signed)
"  This is [place] calling you back.  I'm sorry I missed your call again.  I am actually at work so if you call me back I may miss your call again.  I don't take my lunch until after you guys are closed.  If you could give me a call back."

## 2017-02-14 NOTE — Telephone Encounter (Signed)
"  The reason for my call today is I'm supposed to have a bunion removed tomorrow.  I spoke to the lady in finance at the surgical center and found out my insurance will only pay $400 worth.  I would have to pay the other portion out of pocket.  I don't have that amount of money at the time.  If you could give me a call back."  I called and left Aram BeechamCynthia a message to cancel patient's surgery for today due to financial problems.

## 2017-02-24 ENCOUNTER — Ambulatory Visit: Payer: Managed Care, Other (non HMO)

## 2017-02-27 ENCOUNTER — Telehealth: Payer: Self-pay | Admitting: *Deleted

## 2017-02-27 NOTE — Telephone Encounter (Signed)
Called patient to see how they were doing from surgery that was originally scheduled for 02/14/17 for an [facility] Bunionectomy RT. Pt's mother stated, "My daughter rescheduled".  Pt has cancelled surgery due to finances and has not reschedule surgery.

## 2017-02-27 NOTE — Progress Notes (Addendum)
DOS 02/14/2017 [place] Bunionectomy RT  Pt cancelled due to finances. Pt has not rescheduled surgery yet.

## 2017-05-09 ENCOUNTER — Inpatient Hospital Stay (HOSPITAL_COMMUNITY)
Admission: AD | Admit: 2017-05-09 | Discharge: 2017-05-09 | Disposition: A | Discharge status: Home / Self Care | Payer: Self-pay | Source: Ambulatory Visit | Attending: Obstetrics and Gynecology | Admitting: Obstetrics and Gynecology

## 2017-05-09 ENCOUNTER — Encounter (HOSPITAL_COMMUNITY): Payer: Self-pay | Admitting: *Deleted

## 2017-05-09 DIAGNOSIS — Z833 Family history of diabetes mellitus: Secondary | ICD-10-CM | POA: Insufficient documentation

## 2017-05-09 DIAGNOSIS — R234 Changes in skin texture: Secondary | ICD-10-CM | POA: Insufficient documentation

## 2017-05-09 DIAGNOSIS — Z808 Family history of malignant neoplasm of other organs or systems: Secondary | ICD-10-CM | POA: Insufficient documentation

## 2017-05-09 DIAGNOSIS — Z8249 Family history of ischemic heart disease and other diseases of the circulatory system: Secondary | ICD-10-CM | POA: Insufficient documentation

## 2017-05-09 DIAGNOSIS — N898 Other specified noninflammatory disorders of vagina: Secondary | ICD-10-CM | POA: Insufficient documentation

## 2017-05-09 DIAGNOSIS — Z803 Family history of malignant neoplasm of breast: Secondary | ICD-10-CM | POA: Insufficient documentation

## 2017-05-09 DIAGNOSIS — Z9889 Other specified postprocedural states: Secondary | ICD-10-CM | POA: Insufficient documentation

## 2017-05-09 DIAGNOSIS — Z3202 Encounter for pregnancy test, result negative: Secondary | ICD-10-CM | POA: Insufficient documentation

## 2017-05-09 HISTORY — DX: Other specified health status: Z78.9

## 2017-05-09 LAB — WET PREP, GENITAL
Clue Cells Wet Prep HPF POC: NONE SEEN
SPERM: NONE SEEN
TRICH WET PREP: NONE SEEN
YEAST WET PREP: NONE SEEN

## 2017-05-09 LAB — URINALYSIS, ROUTINE W REFLEX MICROSCOPIC
BACTERIA UA: NONE SEEN
Bilirubin Urine: NEGATIVE
Glucose, UA: NEGATIVE mg/dL
Ketones, ur: NEGATIVE mg/dL
Leukocytes, UA: NEGATIVE
Nitrite: NEGATIVE
Protein, ur: NEGATIVE mg/dL
SPECIFIC GRAVITY, URINE: 1.034 — AB (ref 1.005–1.030)
pH: 5 (ref 5.0–8.0)

## 2017-05-09 LAB — POCT PREGNANCY, URINE: PREG TEST UR: NEGATIVE

## 2017-05-09 MED ORDER — LIDOCAINE HCL 2 % EX GEL
1.0000 "application " | Freq: Once | CUTANEOUS | Status: AC
  Administered 2017-05-09: 1 via TOPICAL
  Filled 2017-05-09: qty 5

## 2017-05-09 NOTE — MAU Provider Note (Signed)
History     CSN: [id number]  Arrival date and time: 05/09/17 [id number]   First Provider Initiated Contact with Patient 05/09/17 1926      Chief Complaint  Patient presents with  . Vaginal Discharge   HPI Robin Chapman is a 31 y.o. [id number] non pregnant female who presents with vaginal discharge. She states for 1 week she has had a discharge that is pink and occasionally brown. There is no odor. She denies any pain. Last intercourse 2 days ago. Patient had a BTL in 2012 and LMP was 03/14/17. She also reports pain from the top of her gluteal cleft. She states the pain started last week and feels like there is an abrasion there.   OB History    Gravida Para Term Preterm AB Living   3 2 1 1  0 2   SAB TAB Ectopic Multiple Live Births   0 0 0 0        Past Medical History:  Diagnosis Date  . Abnormal Pap smear of vagina   . Medical history non-contributory     Past Surgical History:  Procedure Laterality Date  . CESAREAN SECTION    . DILATION AND CURETTAGE OF UTERUS    . HERNIA REPAIR      Family History  Problem Relation Age of Onset  . Cancer Maternal Grandfather        skin cancer  . Cancer Maternal Grandmother        breast  . Hypertension Maternal Grandmother   . Diabetes Paternal Grandmother     Social History  Substance Use Topics  . Smoking status: Never Smoker  . Smokeless tobacco: Never Used  . Alcohol use No    Allergies: No Known Allergies  No prescriptions prior to admission.    Review of Systems  Constitutional: Negative.  Negative for fatigue and fever.  HENT: Negative.   Respiratory: Negative.  Negative for shortness of breath.   Cardiovascular: Negative.  Negative for chest pain.  Gastrointestinal: Negative.  Negative for abdominal pain, constipation, diarrhea, nausea and vomiting.  Genitourinary: Positive for vaginal bleeding and vaginal discharge. Negative for dysuria.  Neurological: Negative.  Negative for dizziness and headaches.    Physical Exam   Blood pressure (!) 151/86, pulse 97, temperature 97.8 F (36.6 C), temperature source Oral, resp. rate 18, height 5' (1.524 m), weight 212 lb 4 oz (96.3 kg), last menstrual period 03/14/2017, SpO2 100 %, not currently breastfeeding.  Physical Exam  Nursing note and vitals reviewed. Constitutional: She is oriented to person, place, and time. She appears well-developed and well-nourished. No distress.  HENT:  Head: Normocephalic.  Eyes: Pupils are equal, round, and reactive to light.  Cardiovascular: Normal rate, regular rhythm and normal heart sounds.   Respiratory: Effort normal and breath sounds normal. No respiratory distress.  GI: Soft. Bowel sounds are normal. She exhibits no distension. There is no tenderness.  Genitourinary: Vaginal discharge (small amount of brown discharge in vault) found.  Musculoskeletal:       Back:  Neurological: She is alert and oriented to person, place, and time.  Skin: Skin is warm and dry.  Psychiatric: She has a normal mood and affect. Her behavior is normal. Judgment and thought content normal.    MAU Course  Procedures Results for orders placed or performed during the hospital encounter of 05/09/17 (from the past 24 hour(s))  Urinalysis, Routine w reflex microscopic     Status: Abnormal   Collection Time: 05/09/17  7:05  PM  Result Value Ref Range   Color, Urine YELLOW YELLOW   APPearance CLEAR CLEAR   Specific Gravity, Urine 1.034 (H) 1.005 - 1.030   pH 5.0 5.0 - 8.0   Glucose, UA NEGATIVE NEGATIVE mg/dL   Hgb urine dipstick SMALL (A) NEGATIVE   Bilirubin Urine NEGATIVE NEGATIVE   Ketones, ur NEGATIVE NEGATIVE mg/dL   Protein, ur NEGATIVE NEGATIVE mg/dL   Nitrite NEGATIVE NEGATIVE   Leukocytes, UA NEGATIVE NEGATIVE   RBC / HPF 0-5 0 - 5 RBC/hpf   WBC, UA 0-5 0 - 5 WBC/hpf   Bacteria, UA NONE SEEN NONE SEEN   Squamous Epithelial / LPF 0-5 (A) NONE SEEN   Mucus PRESENT   Pregnancy, urine POC     Status: None    Collection Time: 05/09/17  7:21 PM  Result Value Ref Range   Preg Test, Ur NEGATIVE NEGATIVE  Wet prep, genital     Status: Abnormal   Collection Time: 05/09/17  7:26 PM  Result Value Ref Range   Yeast Wet Prep HPF POC NONE SEEN NONE SEEN   Trich, Wet Prep NONE SEEN NONE SEEN   Clue Cells Wet Prep HPF POC NONE SEEN NONE SEEN   WBC, Wet Prep HPF POC MODERATE (A) NONE SEEN   Sperm NONE SEEN    MDM UA, UPT Wet prep and gc/chlamydia Lidocaine gel Assessment and Plan   1. Vaginal discharge   2. Fissure in skin    -Discharge home in stable condition -Patient advised to follow-up with [facility] as needed for gyn care -Patient may return to MAU as needed or if her condition were to change or worsen  Rolm Bookbinder CNM 05/09/2017, 7:43 PM

## 2017-05-09 NOTE — MAU Note (Signed)
Pt presents with c/o light pink/brownish vaginal discharge that began Thursday last week.  Denies abdominal cramping.  Pt reports having H/A, nausea, fatigue.  Pt states had BTL in 2012, but questioning if possibly pregnant.  Hasn't taken home UPT.

## 2017-05-11 LAB — GC/CHLAMYDIA PROBE AMP (~~LOC~~) NOT AT ARMC
Chlamydia: NEGATIVE
Neisseria Gonorrhea: NEGATIVE

## 2017-06-28 ENCOUNTER — Ambulatory Visit: Payer: Self-pay | Admitting: Obstetrics & Gynecology

## 2017-06-28 ENCOUNTER — Encounter: Payer: Self-pay | Admitting: *Deleted

## 2017-06-28 NOTE — Progress Notes (Signed)
Robin Chapman did not keep her scheduled appointment for pap smear. Hx of abnormal pap smear . I called [name]'s mobile number  and left a message she missed her appointment and is important to reschedule, Please call us and reschedule. Will send letter.

## 2017-07-27 ENCOUNTER — Ambulatory Visit (INDEPENDENT_AMBULATORY_CARE_PROVIDER_SITE_OTHER): Payer: No Typology Code available for payment source | Admitting: Obstetrics & Gynecology

## 2017-07-27 ENCOUNTER — Encounter: Payer: Self-pay | Admitting: Obstetrics & Gynecology

## 2017-07-27 VITALS — BP 131/89 | HR 88 | Wt 215.9 lb

## 2017-07-27 DIAGNOSIS — Z1151 Encounter for screening for human papillomavirus (HPV): Secondary | ICD-10-CM | POA: Diagnosis not present

## 2017-07-27 DIAGNOSIS — Z113 Encounter for screening for infections with a predominantly sexual mode of transmission: Secondary | ICD-10-CM

## 2017-07-27 DIAGNOSIS — R87610 Atypical squamous cells of undetermined significance on cytologic smear of cervix (ASC-US): Secondary | ICD-10-CM | POA: Diagnosis not present

## 2017-07-27 DIAGNOSIS — Z124 Encounter for screening for malignant neoplasm of cervix: Secondary | ICD-10-CM | POA: Diagnosis not present

## 2017-07-27 DIAGNOSIS — Z01419 Encounter for gynecological examination (general) (routine) without abnormal findings: Secondary | ICD-10-CM

## 2017-07-27 MED ORDER — NORETHINDRONE ACET-ETHINYL EST 1.5-30 MG-MCG PO TABS: 1.0000 | ORAL_TABLET | Freq: Every day | ORAL | 11 refills | Status: DC

## 2017-07-27 NOTE — Progress Notes (Signed)
Subjective:     Robin HellingBrittany D Briley Chapman is a 32 y.o. female here for a routine exam.  Current complaints: irreg bleeding for the past 3 months. She reports that the bleeding is assoc with clots.      Gynecologic History No LMP recorded. Contraception: tubal ligation Last Pap: 11/22/2013 Results were: ASCUS with neg hrHPV Last mammogram: n/a.  Obstetric History OB History  Gravida Para Term Preterm AB Living  3 2 1 1  0 2  SAB TAB Ectopic Multiple Live Births  0 0 0 0      # Outcome Date GA Lbr Len/2nd Weight Sex Delivery Anes PTL Lv  3 Preterm 2012     CS-Unspec     2 Term 2011     CS-Unspec     1 Gravida              The following portions of the patient's history were reviewed and updated as appropriate: allergies, current medications, past family history, past medical history, past social history, past surgical history and problem list.  Review of Systems Pertinent items are noted in HPI.    Objective:  BP 131/89   Pulse 88   Wt 215 lb 14.4 oz (97.9 kg)   LMP 07/03/2017 (Exact Date)   BMI 42.17 kg/m  General Appearance:    Alert, cooperative, no distress, appears stated age  Head:    Normocephalic, without obvious abnormality, atraumatic  Eyes:    conjunctiva/corneas clear, EOM's intact, both eyes  Ears:    Normal external ear canals, both ears  Nose:   Nares normal, septum midline, mucosa normal, no drainage    or sinus tenderness  Throat:   Lips, mucosa, and tongue normal; teeth and gums normal  Neck:   Supple, symmetrical, trachea midline, no adenopathy;    thyroid:  no enlargement/tenderness/nodules  Back:     Symmetric, no curvature, ROM normal, no CVA tenderness  Lungs:     Clear to auscultation bilaterally, respirations unlabored  Chest Wall:    No tenderness or deformity   Heart:    Regular rate and rhythm, S1 and S2 normal, no murmur, rub   or gallop  Breast Exam:    No tenderness, masses, or nipple abnormality  Abdomen:     Soft, non-tender, bowel sounds  active all four quadrants,    no masses, no organomegaly  Genitalia:    Normal female without lesion, discharge or tenderness     Extremities:   Extremities normal, atraumatic, no cyanosis or edema  Pulses:   2+ and symmetric all extremities  Skin:   Skin color, texture, turgor normal, no rashes or lesions     08/06/2015 CLINICAL DATA:  Pelvic pain. Oligomenorrhea. History of tubal ligation, C-section. Gravida 3 para 2, pre term 1. LMP 4 years ago.  EXAM: TRANSABDOMINAL AND TRANSVAGINAL ULTRASOUND OF PELVIS  TECHNIQUE: Both transabdominal and transvaginal ultrasound examinations of the pelvis were performed. Transabdominal technique was performed for global imaging of the pelvis including uterus, ovaries, adnexal regions, and pelvic cul-de-sac. It was necessary to proceed with endovaginal exam following the transabdominal exam to visualize the endometrium and ovaries.  COMPARISON:  None applicable  FINDINGS: Uterus  Measurements: 9.3 x 3.9 x 5.2 cm. No fibroids or other mass visualized.  Endometrium  Thickness: 6.7 mm.  No focal abnormality visualized.  Right ovary  Measurements: 3.3 x 2.5 x 2.5 cm. Normal appearance/no adnexal mass.  Left ovary  Measurements: 2.8 x 1.9 x 1.9 cm. Normal appearance/no  adnexal mass.  Other findings  No free pelvic fluid.  IMPRESSION: Normal pelvic ultrasound.  Thin endometrial stripe.   Assessment:    Healthy female exam.   H/o ASCUS PAP AUB- will try OCOPs. Pt with no contraindications.  Pt has prev been on OCPs .    Plan:   F/u PAP and hrHPV F/u in 1 year or sooner prn F/u 6 months LoEstrin 1.5/30 1 po q day May stop OCPs in 3 months   Sindy Mccune L. Harraway-Smith, M.D., [facility]

## 2017-07-27 NOTE — Patient Instructions (Signed)

## 2017-08-01 LAB — CYTOLOGY - PAP
BACTERIAL VAGINITIS: POSITIVE — AB
Candida vaginitis: NEGATIVE
Chlamydia: NEGATIVE
DIAGNOSIS: UNDETERMINED — AB
HPV: NOT DETECTED
NEISSERIA GONORRHEA: NEGATIVE
Trichomonas: NEGATIVE

## 2017-08-07 ENCOUNTER — Encounter: Payer: Self-pay | Admitting: Obstetrics & Gynecology

## 2017-08-07 ENCOUNTER — Other Ambulatory Visit: Payer: Self-pay | Admitting: Obstetrics & Gynecology

## 2017-08-07 DIAGNOSIS — N760 Acute vaginitis: Principal | ICD-10-CM

## 2017-08-07 DIAGNOSIS — B9689 Other specified bacterial agents as the cause of diseases classified elsewhere: Secondary | ICD-10-CM

## 2017-08-07 MED ORDER — METRONIDAZOLE 500 MG PO TABS: 500.0000 mg | ORAL_TABLET | Freq: Two times a day (BID) | ORAL | 0 refills | Status: DC

## 2017-10-04 ENCOUNTER — Inpatient Hospital Stay (HOSPITAL_COMMUNITY)
Admission: AD | Admit: 2017-10-04 | Discharge: 2017-10-05 | Disposition: A | Discharge status: Home / Self Care | Payer: No Typology Code available for payment source | Source: Ambulatory Visit | Attending: Obstetrics & Gynecology | Admitting: Obstetrics & Gynecology

## 2017-10-04 DIAGNOSIS — N939 Abnormal uterine and vaginal bleeding, unspecified: Secondary | ICD-10-CM | POA: Insufficient documentation

## 2017-10-04 LAB — CBC
HCT: 33 % — ABNORMAL LOW (ref 36.0–46.0)
Hemoglobin: 10.5 g/dL — ABNORMAL LOW (ref 12.0–15.0)
MCH: 27.7 pg (ref 26.0–34.0)
MCHC: 31.8 g/dL (ref 30.0–36.0)
MCV: 87.1 fL (ref 78.0–100.0)
PLATELETS: 303 10*3/uL (ref 150–400)
RBC: 3.79 MIL/uL — ABNORMAL LOW (ref 3.87–5.11)
RDW: 14 % (ref 11.5–15.5)
WBC: 11.4 10*3/uL — ABNORMAL HIGH (ref 4.0–10.5)

## 2017-10-04 LAB — URINALYSIS, ROUTINE W REFLEX MICROSCOPIC
BACTERIA UA: NONE SEEN
Bilirubin Urine: NEGATIVE
GLUCOSE, UA: NEGATIVE mg/dL
KETONES UR: NEGATIVE mg/dL
Leukocytes, UA: NEGATIVE
NITRITE: NEGATIVE
Protein, ur: 30 mg/dL — AB
Specific Gravity, Urine: 1.03 (ref 1.005–1.030)
pH: 5 (ref 5.0–8.0)

## 2017-10-04 LAB — POCT PREGNANCY, URINE: PREG TEST UR: NEGATIVE

## 2017-10-04 MED ORDER — LACTATED RINGERS IV BOLUS
1000.0000 mL | Freq: Once | INTRAVENOUS | Status: AC
  Administered 2017-10-04: 1000 mL via INTRAVENOUS

## 2017-10-04 MED ORDER — KETOROLAC TROMETHAMINE 60 MG/2ML IM SOLN
60.0000 mg | INTRAMUSCULAR | Status: AC
  Administered 2017-10-04: 60 mg via INTRAMUSCULAR
  Filled 2017-10-04: qty 2

## 2017-10-04 NOTE — MAU Provider Note (Signed)
History     CSN: [id number]  Arrival date and time: 10/04/17 2204   First Provider Initiated Contact with Patient 10/04/17 2248      Chief Complaint  Patient presents with  . Vaginal Bleeding   HPI  Ms.  Robin Chapman is a 32 y.o. year old [id number] non-pregnant female who presents to MAU reporting her last menstrual cycle was from 2/25 until 3/11. She saw Dr. Erin Fulling who started her on OCPs. She started taking the OCPs 3/1. She started bleeding heavily on Sunday 3/24. She reports passing large clots, soaking through 2 super tampons at a time, having to change them every 15 mins -- causing blood to leak through onto her clothes.   Past Medical History:  Diagnosis Date  . Abnormal Pap smear of vagina   . Medical history non-contributory     Past Surgical History:  Procedure Laterality Date  . CESAREAN SECTION    . DILATION AND CURETTAGE OF UTERUS    . HERNIA REPAIR      Family History  Problem Relation Age of Onset  . Cancer Maternal Grandfather        skin cancer  . Cancer Maternal Grandmother        breast  . Hypertension Maternal Grandmother   . Diabetes Paternal Grandmother     Social History   Tobacco Use  . Smoking status: Never Smoker  . Smokeless tobacco: Never Used  Substance Use Topics  . Alcohol use: No  . Drug use: No    Allergies: No Known Allergies  Medications Prior to Admission  Medication Sig Dispense Refill Last Dose  . Norethindrone Acetate-Ethinyl Estradiol (JUNEL,LOESTRIN,MICROGESTIN) 1.5-30 MG-MCG tablet Take 1 tablet by mouth daily. 1 Package 11 10/03/2017 at Unknown time  . metroNIDAZOLE (FLAGYL) 500 MG tablet Take 1 tablet (500 mg total) by mouth 2 (two) times daily. 14 tablet 0     Review of Systems  Constitutional: Negative.   HENT: Negative.   Eyes: Negative.   Respiratory: Negative.   Cardiovascular: Negative.   Gastrointestinal: Positive for abdominal pain.  Endocrine: Negative.   Genitourinary: Positive  for pelvic pain (constant cramping that increases just before she passes a clot) and vaginal bleeding ("heavily with large clots").  Musculoskeletal: Negative.   Skin: Negative.   Allergic/Immunologic: Negative.   Neurological: Negative.   Hematological: Negative.   Psychiatric/Behavioral: Negative.    Physical Exam   Blood pressure 126/65, pulse 94, temperature 98 F (36.7 C), temperature source Oral, resp. rate 19, height 5' (1.524 m), weight 216 lb (98 kg), last menstrual period 09/18/2017, SpO2 97 %.  Physical Exam  Nursing note and vitals reviewed. Constitutional: She is oriented to person, place, and time. She appears well-developed and well-nourished.  HENT:  Head: Normocephalic and atraumatic.  Eyes: Pupils are equal, round, and reactive to light.  Neck: Normal range of motion.  Cardiovascular: Normal rate, regular rhythm and normal heart sounds.  Respiratory: Effort normal and breath sounds normal.  GI: Soft. Bowel sounds are normal.  Genitourinary:  Genitourinary Comments: Uterus: non-tender, SE: cervix is smooth, pink, no lesions, large amt of bright, red blood and clots, closed/long/firm, no CMT or friability, bilateral adnexal tenderness   Musculoskeletal: Normal range of motion.  Neurological: She is alert and oriented to person, place, and time.  Skin: Skin is warm and dry.  Psychiatric: She has a normal mood and affect. Her behavior is normal. Judgment and thought content normal.    MAU Course  Procedures  MDM CCUA  Results for orders placed or performed during the hospital encounter of 10/04/17 (from the past 24 hour(s))  Urinalysis, Routine w reflex microscopic     Status: Abnormal   Collection Time: 10/04/17 10:11 PM  Result Value Ref Range   Color, Urine YELLOW YELLOW   APPearance HAZY (A) CLEAR   Specific Gravity, Urine 1.030 1.005 - 1.030   pH 5.0 5.0 - 8.0   Glucose, UA NEGATIVE NEGATIVE mg/dL   Hgb urine dipstick LARGE (A) NEGATIVE   Bilirubin  Urine NEGATIVE NEGATIVE   Ketones, ur NEGATIVE NEGATIVE mg/dL   Protein, ur 30 (A) NEGATIVE mg/dL   Nitrite NEGATIVE NEGATIVE   Leukocytes, UA NEGATIVE NEGATIVE   RBC / HPF TOO NUMEROUS TO COUNT 0 - 5 RBC/hpf   WBC, UA 6-30 0 - 5 WBC/hpf   Bacteria, UA NONE SEEN NONE SEEN   Squamous Epithelial / LPF 0-5 (A) NONE SEEN   Mucus PRESENT   Pregnancy, urine POC     Status: None   Collection Time: 10/04/17 10:30 PM  Result Value Ref Range   Preg Test, Ur NEGATIVE NEGATIVE  CBC     Status: Abnormal   Collection Time: 10/04/17 11:10 PM  Result Value Ref Range   WBC 11.4 (H) 4.0 - 10.5 K/uL   RBC 3.79 (L) 3.87 - 5.11 MIL/uL   Hemoglobin 10.5 (L) 12.0 - 15.0 g/dL   HCT 21.333.0 (L) 08.636.0 - 57.846.0 %   MCV 87.1 78.0 - 100.0 fL   MCH 27.7 26.0 - 34.0 pg   MCHC 31.8 30.0 - 36.0 g/dL   RDW 46.914.0 62.911.5 - 52.815.5 %   Platelets 303 150 - 400 K/uL    Assessment and Plan  Abnormal uterine bleeding (AUB)  - Advised to continue OCPs as previously prescribed - Rx for Megace 80 mg BID until seen in [facility] - Information provided on AUB - Discharge patient - Patient verbalized an understanding of the plan of care and agrees.   Raelyn Moraolitta Webber Michiels, MSN, CNM 10/04/2017, 10:49 PM

## 2017-10-04 NOTE — MAU Note (Signed)
Pt reports heavy vaginal bleeding since Sunday. States she has passed large blood clots-the largest was being golf ball size. States she is wearing super sized pads and tampons and has been changing them every 15 mins. States she has leaked through them all onto clothes. LMP: 09/18/2016

## 2017-10-05 DIAGNOSIS — N939 Abnormal uterine and vaginal bleeding, unspecified: Secondary | ICD-10-CM

## 2017-10-05 MED ORDER — MEGESTROL ACETATE 40 MG PO TABS: 80.0000 mg | ORAL_TABLET | Freq: Two times a day (BID) | ORAL | 1 refills | Status: DC

## 2017-11-27 ENCOUNTER — Encounter: Payer: Self-pay | Admitting: Obstetrics & Gynecology

## 2017-11-27 ENCOUNTER — Ambulatory Visit (INDEPENDENT_AMBULATORY_CARE_PROVIDER_SITE_OTHER): Payer: BC Managed Care – PPO | Admitting: Obstetrics & Gynecology

## 2017-11-27 VITALS — BP 134/89 | HR 104 | Ht 60.0 in | Wt 211.0 lb

## 2017-11-27 DIAGNOSIS — N898 Other specified noninflammatory disorders of vagina: Secondary | ICD-10-CM

## 2017-11-27 DIAGNOSIS — D500 Iron deficiency anemia secondary to blood loss (chronic): Secondary | ICD-10-CM | POA: Diagnosis not present

## 2017-11-27 DIAGNOSIS — Z113 Encounter for screening for infections with a predominantly sexual mode of transmission: Secondary | ICD-10-CM | POA: Diagnosis not present

## 2017-11-27 DIAGNOSIS — N920 Excessive and frequent menstruation with regular cycle: Secondary | ICD-10-CM | POA: Diagnosis not present

## 2017-11-27 DIAGNOSIS — R102 Pelvic and perineal pain: Secondary | ICD-10-CM

## 2017-11-27 MED ORDER — MEGESTROL ACETATE 40 MG PO TABS
80.0000 mg | ORAL_TABLET | Freq: Two times a day (BID) | ORAL | 4 refills | Status: DC
Start: 2017-11-27 — End: 2018-02-19

## 2017-11-27 NOTE — Patient Instructions (Addendum)
GO WHITE: Soap: UNSCENTED Dove (white box light green writing) Laundry detergent (underwear)- Dreft or [employer] unscented WHITE 100% cotton panties (NOT just cotton crouch) Sanitary napkin/panty liners: UNSCENTED.  If it doesn't SAY unscented it can have a scent/perfume    NO PERFUMES OR LOTIONS OR POTIONS in the vulvar area (may use regular [place]) Condoms: hypoallergenic only. Non dyed (no color) Toilet papers: white only Wash clothes: use a separate wash cloth. WHITE.  Washed in Dreft.    Dr. Margarite Gouge [phone]

## 2017-11-27 NOTE — Progress Notes (Addendum)
History:  32 y.o. [id number] here today for f/u of AUB.  She was on OCPs with no resolution or control of the bleeding.  She was seen in the[facility] and was started on Megace which helps to control her cycles better than the OCPs did.  She has had a BTL but, now she is interested in conceiving. She wants a referral to Dr. Margarite Gouge.    Pt also c/o a vaginal odor similar to when she was dx'd with BV.   Marland Kitchen    The following portions of the patient's history were reviewed and updated as appropriate: allergies, current medications, past family history, past medical history, past social history, past surgical history and problem list.  Review of Systems:  Pertinent items are noted in HPI.    Objective:  Physical Exam BP 134/89   Pulse (!) 104   Ht 5' (1.524 m)   Wt 211 lb (95.7 kg)   LMP 09/08/2017 (Within Days)   BMI 41.21 kg/m   CONSTITUTIONAL: Well-developed, well-nourished female in no acute distress.  HENT:  Normocephalic, atraumatic EYES: Conjunctivae and EOM are normal. No scleral icterus.  NECK: Normal range of motion SKIN: Skin is warm and dry. No rash noted. Not diaphoretic.No pallor. NEUROLGIC: Alert and oriented to person, place, and time. Normal coordination.  Abd: Soft, nontender and nondistended; obese Pelvic: Normal appearing external genitalia; normal appearing vaginal mucosa and cervix.  Normal discharge- some old blood in the discharge.  Small uterus, no other palpable masses, no uterine tenderness, there is some right adnexal tenderness.    Labs and Imaging 08/06/2015 CLINICAL DATA:  Pelvic pain. Oligomenorrhea. History of tubal ligation, C-section. Gravida 3 para 2, pre term 1. LMP 4 years ago.  EXAM: TRANSABDOMINAL AND TRANSVAGINAL ULTRASOUND OF PELVIS  TECHNIQUE: Both transabdominal and transvaginal ultrasound examinations of the pelvis were performed. Transabdominal technique was performed for global imaging of the pelvis including uterus, ovaries, adnexal regions,  and pelvic cul-de-sac. It was necessary to proceed with endovaginal exam following the transabdominal exam to visualize the endometrium and ovaries.  COMPARISON:  None applicable  FINDINGS: Uterus  Measurements: 9.3 x 3.9 x 5.2 cm. No fibroids or other mass visualized.  Endometrium  Thickness: 6.7 mm.  No focal abnormality visualized.  Right ovary  Measurements: 3.3 x 2.5 x 2.5 cm. Normal appearance/no adnexal mass.  Left ovary  Measurements: 2.8 x 1.9 x 1.9 cm. Normal appearance/no adnexal mass.  Other findings  No free pelvic fluid.  IMPRESSION: Normal pelvic ultrasound.  Thin endometrial stripe.  Assessment & Plan:  AUB/plevic pain  Pelvic US  Keep Megace  bid  F/u in 3 months   Anemia  FeSo4 daily Pt will be offererd to Dr. Montel Culver to see if he will do a tubal reanastomosis vs IVF  Vaginal odor  F/u wet smear  Anahita Cua L. Harraway-Smith, M.D., [facility]

## 2017-11-28 LAB — CERVICOVAGINAL ANCILLARY ONLY
Bacterial vaginitis: NEGATIVE
CANDIDA VAGINITIS: NEGATIVE
TRICH (WINDOWPATH): NEGATIVE

## 2017-12-06 ENCOUNTER — Ambulatory Visit (HOSPITAL_COMMUNITY)
Admission: RE | Admit: 2017-12-06 | Discharge: 2017-12-06 | Disposition: A | Discharge status: Home / Self Care | Payer: BC Managed Care – PPO | Source: Ambulatory Visit | Attending: Obstetrics & Gynecology | Admitting: Obstetrics & Gynecology

## 2017-12-06 DIAGNOSIS — R102 Pelvic and perineal pain: Secondary | ICD-10-CM | POA: Insufficient documentation

## 2017-12-06 DIAGNOSIS — N920 Excessive and frequent menstruation with regular cycle: Secondary | ICD-10-CM | POA: Diagnosis present

## 2017-12-08 ENCOUNTER — Encounter: Payer: Self-pay | Admitting: Obstetrics & Gynecology

## 2018-01-16 ENCOUNTER — Encounter: Payer: Self-pay | Admitting: Obstetrics & Gynecology

## 2018-02-12 ENCOUNTER — Encounter: Payer: Self-pay | Admitting: Obstetrics & Gynecology

## 2018-02-19 ENCOUNTER — Encounter: Payer: Self-pay | Admitting: Obstetrics & Gynecology

## 2018-02-19 ENCOUNTER — Ambulatory Visit (INDEPENDENT_AMBULATORY_CARE_PROVIDER_SITE_OTHER): Payer: BC Managed Care – PPO | Admitting: Obstetrics & Gynecology

## 2018-02-19 VITALS — BP 128/90 | HR 96 | Ht 65.0 in | Wt 204.0 lb

## 2018-02-19 DIAGNOSIS — N939 Abnormal uterine and vaginal bleeding, unspecified: Secondary | ICD-10-CM | POA: Diagnosis not present

## 2018-02-19 DIAGNOSIS — Z20828 Contact with and (suspected) exposure to other viral communicable diseases: Secondary | ICD-10-CM | POA: Diagnosis not present

## 2018-02-19 DIAGNOSIS — Z113 Encounter for screening for infections with a predominantly sexual mode of transmission: Secondary | ICD-10-CM

## 2018-02-19 MED ORDER — MEGESTROL ACETATE 40 MG PO TABS: 80.0000 mg | ORAL_TABLET | Freq: Two times a day (BID) | ORAL | 6 refills | Status: DC

## 2018-02-19 NOTE — Patient Instructions (Signed)

## 2018-02-19 NOTE — Progress Notes (Signed)
History:  32 y.o. 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 Pt has been mongomous for 10 years. Her sposue had lesion. She is unsure if she or her souse had a prior history of lesions. She reports a prior h/o pain in her buttocks.  The following portions of the patient's history were reviewed and updated as appropriate: allergies, current medications, past family history, past medical history, past social history, past surgical history and problem list.  Review of Systems:  Pertinent items are noted in HPI.    Objective:  Physical Exam BP 128/90   Pulse 96   Ht 5\' 5"  (1.651 m)   Wt 204 lb (92.5 kg)   LMP 02/18/2018 (Exact Date)   BMI 33.95 kg/m   CONSTITUTIONAL: Well-developed, well-nourished female in no acute distress.  HENT:  Normocephalic, atraumatic EYES: Conjunctivae and EOM are normal. No scleral icterus.  NECK: Normal range of motion SKIN: Skin is warm and dry. No rash noted. Not diaphoretic.No pallor. NEUROLGIC: Alert and oriented to person, place, and time. Normal coordination.  Abd: Soft, nontender and nondistended Pelvic: Normal appearing external genitalia; normal appearing vaginal mucosa and cervix.  Normal discharge.     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  Greater than 50% was spent in counseling and coordination of care with the patient.   Sharelle Burditt L. Harraway-Smith, M.D., Ever[facility] CoreFACOG

## 2018-02-21 LAB — HEPATITIS C ANTIBODY: Hep C Virus Ab: 0.4 s/co ratio (ref 0.0–0.9)

## 2018-02-21 LAB — SYPHILIS: RPR W/REFLEX TO RPR TITER AND TREPONEMAL ANTIBODIES, TRADITIONAL SCREENING AND DIAGNOSIS ALGORITHM: RPR Ser Ql: NONREACTIVE

## 2018-02-21 LAB — HEPATITIS B SURFACE ANTIGEN: Hepatitis B Surface Ag: NEGATIVE

## 2018-02-21 LAB — HSV(HERPES SMPLX)ABS-I+II(IGG+IGM)-BLD
HSV 1 Glycoprotein G Ab, IgG: 26.9 index — ABNORMAL HIGH (ref 0.00–0.90)
HSV 2 IgG, Type Spec: 12.1 index — ABNORMAL HIGH (ref 0.00–0.90)
HSVI/II COMB AB IGM: 1.81 ratio — AB (ref 0.00–0.90)

## 2018-02-21 LAB — HIV ANTIBODY (ROUTINE TESTING W REFLEX): HIV SCREEN 4TH GENERATION: NONREACTIVE

## 2018-02-25 ENCOUNTER — Encounter: Payer: Self-pay | Admitting: Obstetrics & Gynecology

## 2018-05-17 ENCOUNTER — Encounter (HOSPITAL_COMMUNITY): Payer: Self-pay | Admitting: Emergency Medicine

## 2018-05-17 ENCOUNTER — Ambulatory Visit (HOSPITAL_COMMUNITY)
Admission: EM | Admit: 2018-05-17 | Discharge: 2018-05-17 | Disposition: A | Discharge status: Home / Self Care | Payer: BC Managed Care – PPO | Attending: Family Medicine | Admitting: Family Medicine

## 2018-05-17 DIAGNOSIS — R0981 Nasal congestion: Secondary | ICD-10-CM

## 2018-05-17 DIAGNOSIS — R05 Cough: Secondary | ICD-10-CM

## 2018-05-17 DIAGNOSIS — J029 Acute pharyngitis, unspecified: Secondary | ICD-10-CM | POA: Insufficient documentation

## 2018-05-17 LAB — POCT RAPID STREP A: Streptococcus, Group A Screen (Direct): NEGATIVE

## 2018-05-17 MED ORDER — AMOXICILLIN 500 MG PO CAPS: 500.0000 mg | ORAL_CAPSULE | Freq: Two times a day (BID) | ORAL | 0 refills | Status: AC

## 2018-05-17 NOTE — Discharge Instructions (Signed)
Sore Throat  Your rapid strep tested Negative today. We will send for a culture and call in about 2 days if results are positive. Based off exposure to son and appearance of tonsils- begin amoxicillin twice daily for 10 days  Please continue Tylenol or Ibuprofen for fever and pain. May try salt water gargles, cepacol lozenges, throat spray, or OTC cold relief medicine for throat discomfort. If you also have congestion take a daily anti-histamine like Zyrtec, Claritin, and a oral decongestant to help with post nasal drip that may be irritating your throat.   Stay hydrated and drink plenty of fluids to keep your throat coated relieve irritation.

## 2018-05-17 NOTE — ED Triage Notes (Signed)
Pt c/o sore throat x2 days, worried about strep, son was dx with it last week.

## 2018-05-17 NOTE — ED Provider Notes (Signed)
[facility]    CSN: [id number] Arrival date & time: 05/17/18  1030     History   Chief Complaint Chief Complaint  Patient presents with  . Sore Throat    HPI [place] is a 32 y.o. female no significant past medical history presenting today for evaluation of sore throat.  Patient states that over the past 2 days she has developed worsening sore throat.  She is also felt very fatigued and weak.  She has had some mild congestion, and frequent coughing.  She tried salt water gargles without relief.  She notes that her son tested positive for strep and has been on medicine for this.  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  She denies fevers.  Denies difficulty swallowing.  HPI  Past Medical History:  Diagnosis Date  . Abnormal Pap smear of vagina   . Medical history non-contributory     Patient Active Problem List   Diagnosis Date Noted  . Abnormal uterine bleeding (AUB) 10/05/2017  . 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 History of abnormal cervical Pap smear 07/31/2015    Past Surgical History:  Procedure Laterality Date  . CESAREAN SECTION    . DILATION AND CURETTAGE OF UTERUS    . HERNIA REPAIR      OB History    Gravida  3   Para  2   Term  1   Preterm  1   AB  0   Living  2     SAB  0   TAB  0   Ectopic  0   Multiple  0   Live Births               Home Medications    Prior to Admission medications   Medication Sig Start Date End Date Taking? Authorizing Provider  amoxicillin (AMOXIL) 500 MG capsule Take 1 capsule (500 mg total) by mouth 2 (two) times daily for 10 days. 05/17/18 05/27/18  Mathan Darroch C, PA-C  megestrol (MEGACE) 40 MG tablet Take 2 tablets (80 mg total) by mouth 2 (two) times daily. 02/19/18   Willodean Rosenthal, MD    Family History Family History  Problem Relation Age of Onset  . Cancer Maternal Grandfather        skin cancer  . Cancer Maternal Grandmother        breast    . Hypertension Maternal Grandmother   . Diabetes Paternal Grandmother     Social History Social History   Tobacco Use  . Smoking status: Never Smoker  . Smokeless tobacco: Never Used  Substance Use Topics  . Alcohol use: No  . Drug use: No     Allergies   Patient has no known allergies.   Review of Systems Review of Systems  Constitutional: Positive for fatigue. Negative for activity change, appetite change, chills and fever.  HENT: Positive for congestion, rhinorrhea and sore throat. Negative for ear pain, sinus pressure and trouble swallowing.   Eyes: Negative for discharge and redness.  Respiratory: Positive for cough. Negative for chest tightness and shortness of breath.   Cardiovascular: Negative for chest pain.  Gastrointestinal: Negative for abdominal pain, diarrhea, nausea and vomiting.  Musculoskeletal: Negative for myalgias.  Skin: Negative for rash.  Neurological: Negative for dizziness, light-headedness and headaches.     Physical Exam Triage Vital Signs ED Triage Vitals [05/17/18 1128]  Enc Vitals Group     BP 124/81     Pulse  Rate 92     Resp 16     Temp 98.3 F (36.8 C)     Temp src      SpO2 100 %     Weight      Height      Head Circumference      Peak Flow      Pain Score 0     Pain Loc      Pain Edu?      Excl. in GC?    No data found.  Updated Vital Signs BP 124/81   Pulse 92   Temp 98.3 F (36.8 C)   Resp 16   SpO2 100%   Visual Acuity Right Eye Distance:   Left Eye Distance:   Bilateral Distance:    Right Eye Near:   Left Eye Near:    Bilateral Near:     Physical Exam  Constitutional: She appears well-developed and well-nourished. No distress.  HENT:  Head: Normocephalic and atraumatic.  Bilateral ears without tenderness to palpation of external auricle, tragus and mastoid, EAC's without erythema or swelling, TM's with good bony landmarks and cone of light. Non erythematous.  Oral mucosa pink and moist, minimal  tonsillar enlargement, no exudate, tonsils erythematous extending onto soft palate, no soft palate swelling posterior pharynx patent and erythematous, no uvula deviation or swelling. Normal phonation.   Eyes: Conjunctivae are normal.  Neck: Neck supple.  Cardiovascular: Normal rate and regular rhythm.  No murmur heard. Pulmonary/Chest: Effort normal and breath sounds normal. No respiratory distress.  Breathing comfortably at rest, CTABL, no wheezing, rales or other adventitious sounds auscultated  Abdominal: Soft. There is no tenderness.  Musculoskeletal: She exhibits no edema.  Neurological: She is alert.  Skin: Skin is warm and dry.  Psychiatric: She has a normal mood and affect.  Nursing note and vitals reviewed.    UC Treatments / Results  Labs (all labs ordered are listed, but only abnormal results are displayed) Labs Reviewed  CULTURE, GROUP A STREP [facility])  POCT RAPID STREP A    EKG None  Radiology No results found.  Procedures Procedures (including critical care time)  Medications Ordered in UC Medications - No data to display  Initial Impression / Assessment and Plan / UC Course  I have reviewed the triage vital signs and the nursing notes.  Pertinent labs & imaging results that were available during my care of the patient were reviewed by me and considered in my medical decision making (see chart for details).     Strep test negative, given appearance of tonsils and recent exposure to strep, will go ahead and treat with amoxicillin today.  Discussed further OTC recommendations for relieving sore throat.  Continue to monitor symptoms,Discussed strict return precautions. Patient verbalized understanding and is agreeable with plan.  Final Clinical Impressions(s) / UC Diagnoses   Final diagnoses:  Sore throat     Discharge Instructions     Sore Throat  Your rapid strep tested Negative today. We will send for a culture and call in about 2 days if results  are positive. Based off exposure to son and appearance of tonsils- begin amoxicillin twice daily for 10 days  Please continue Tylenol or Ibuprofen for fever and pain. May try salt water gargles, cepacol lozenges, throat spray, or OTC cold relief medicine for throat discomfort. If you also have congestion take a daily anti-histamine like Zyrtec, Claritin, and a oral decongestant to help with post nasal drip that may be irritating your  throat.   Stay hydrated and drink plenty of fluids to keep your throat coated relieve irritation.    ED Prescriptions    Medication Sig Dispense Auth. Provider   amoxicillin (AMOXIL) 500 MG capsule Take 1 capsule (500 mg total) by mouth 2 (two) times daily for 10 days. 20 capsule Carron Mcmurry C, PA-C     Controlled Substance Prescriptions [place] Controlled Substance Registry consulted? Not Applicable   Lew Dawes, [place] 05/17/18 1220

## 2018-05-21 ENCOUNTER — Telehealth (HOSPITAL_COMMUNITY): Payer: Self-pay | Admitting: Emergency Medicine

## 2018-05-21 LAB — CULTURE, GROUP A STREP (THRC)

## 2018-05-21 NOTE — Telephone Encounter (Signed)
Culture is positive for group A Strep germ. This was treated at the urgent care visit with amoxicillin. Attempted to reach patient. No answer at this time. Voicemail left.

## 2019-07-25 ENCOUNTER — Telehealth: Payer: BC Managed Care – PPO | Admitting: Physician Assistant

## 2019-07-25 DIAGNOSIS — Z20822 Contact with and (suspected) exposure to covid-19: Secondary | ICD-10-CM

## 2019-07-25 DIAGNOSIS — R0981 Nasal congestion: Secondary | ICD-10-CM

## 2019-07-25 MED ORDER — BENZONATATE 100 MG PO CAPS
100.0000 mg | ORAL_CAPSULE | Freq: Three times a day (TID) | ORAL | 0 refills | Status: DC | PRN
Start: 2019-07-25 — End: 2021-11-18

## 2019-07-25 MED ORDER — IPRATROPIUM BROMIDE 0.03 % NA SOLN: 2.0000 | Freq: Three times a day (TID) | NASAL | 0 refills | Status: DC | PRN

## 2019-07-25 NOTE — Progress Notes (Signed)
E-Visit for Corona Virus Screening  Your current symptoms could be consistent with the coronavirus.  Many health care providers can now test patients at their office but not all are.  [facility] has multiple testing sites. For information on our [place] testing locations and hours go to [email or web address]  We are enrolling you in our [place] for [place] . Daily you will receive a questionnaire within the [facility] website. Our COVID 19 response team will be monitoring your responses daily.  Testing Information: The COVID-19 Community Testing sites will begin testing BY APPOINTMENT ONLY.  You can schedule online at [email or web address]  If you do not have access to a smart phone or computer you may call [phone] for an appointment.  Testing Locations: Appointment schedule is 8 am to 3:30 pm at all sites  [facility] indoors at [address] [place] [address] [facility]  indoors at [place]. [address], [place], [place] [address] [place] indoors at [address], [place] [address]  Additional testing sites in the Community:  . For [facility] Testing sites in [facility]  FaceUpdate.uy  . For Pop-up testing sites in [place]  BowlDirectory.co.uk  . For Testing sites with regular hours [email or web address]  . For [place] [email or web address].si  . For [facility] [email or web address]  . For [facility] testing in [place] and [employer] [email or web address]  . For [facility] testing in [facility]   [email or web address].[email or web address]  For  more information  about community testing call [phone]   We are enrolling you in our [place] for [place] . Daily you will receive a questionnaire within the [place] website. Our COVID 19 response team will be monitoring your responses daily.  Please quarantine yourself while awaiting your test results. Please stay home for a minimum of 10 days from the first day of illness with improving symptoms and you have had 24 hours of no fever (without the use of Tylenol (Acetaminophen) Motrin (Ibuprofen) or any fever reducing medication).  Also - Do not get tested prior to returning to [facility] because once you have had a positive test the test can stay positive for more then a month in some cases.   You should wear a mask or cloth face covering over your nose and mouth if you must be around other people or animals, including pets (even at home). Try to stay at least 6 feet away from other people. This will protect the people around you.  Please continue good preventive care measures, including:  frequent hand-washing, avoid touching your face, cover coughs/sneezes, stay out of crowds and keep a 6 foot distance from others.  COVID-19 is a respiratory illness with symptoms that are similar to the flu. Symptoms are typically mild to moderate, but there have been cases of severe illness and death due to the virus.   The following symptoms may appear 2-14 days after exposure: . Fever . Cough . Shortness of breath or difficulty breathing . Chills . Repeated shaking with chills . Muscle pain . Headache . Sore throat . New loss of taste or smell . Fatigue . Congestion or runny nose . Nausea or vomiting . Diarrhea  Go to the nearest hospital ED for assessment if fever/cough/breathlessness are severe or illness seems like a threat to life.  It is vitally important that if you feel that you have an infection such as this virus or any other virus that you stay home and away from places  where you may spread it to  others.  You should avoid contact with people age 42 and older.   You can use medication such as A prescription cough medication called Tessalon Perles 100 mg. You may take 1-2 capsules every 8 hours as needed for cough. I have also prescribed ipratropium nasal spray for nasal congestion to be used up to 3 times daily as needed for nasal congestion.   You may also take acetaminophen (Tylenol) as needed for fever. 1-2 tablets every 6 hours as needed for pain or fever.   Reduce your risk of any infection by using the same precautions used for avoiding the common cold or flu:  Marland Kitchen Wash your hands often with soap and warm water for at least 20 seconds.  If soap and water are not readily available, use an alcohol-based hand sanitizer with at least 60% alcohol.  . If coughing or sneezing, cover your mouth and nose by coughing or sneezing into the elbow areas of your shirt or coat, into a tissue or into your sleeve (not your hands). . Avoid shaking hands with others and consider head nods or verbal greetings only. . Avoid touching your eyes, nose, or mouth with unwashed hands.  . Avoid close contact with people who are sick. . Avoid places or events with large numbers of people in one location, like concerts or sporting events. . Carefully consider travel plans you have or are making. . If you are planning any travel outside or inside the [place], visit the CDC's Travelers' Health webpage for the latest health notices. . If you have some symptoms but not all symptoms, continue to monitor at home and seek medical attention if your symptoms worsen. . If you are having a medical emergency, call 911.  HOME CARE . Only take medications as instructed by your medical team. . Drink plenty of fluids and get plenty of rest. . A steam or ultrasonic humidifier can help if you have congestion.   GET HELP RIGHT AWAY IF YOU HAVE EMERGENCY WARNING SIGNS** FOR COVID-19. If you or someone is showing any of these signs seek  emergency medical care immediately. Call 911 or proceed to your closest emergency facility if: . You develop worsening high fever. . Trouble breathing . Bluish lips or face . Persistent pain or pressure in the chest . New confusion . Inability to wake or stay awake . You cough up blood. . Your symptoms become more severe  **This list is not all possible symptoms. Contact your medical provider for any symptoms that are sever or concerning to you.  MAKE SURE YOU   Understand these instructions.  Will watch your condition.  Will get help right away if you are not doing well or get worse.  Your e-visit answers were reviewed by a board certified advanced clinical practitioner to complete your personal care plan.  Depending on the condition, your plan could have included both over the counter or prescription medications.  If there is a problem please reply once you have received a response from your provider.  Your safety is important to [place].  If you have drug allergies check your prescription carefully.    You can use MyChart to ask questions about today's visit, request a non-urgent call back, or ask for a work or school excuse for 24 hours related to this e-Visit. If it has been greater than 24 hours you will need to follow up with your provider, or enter a new e-Visit to address those  concerns. You will get an e-mail in the next two days asking about your experience.  I hope that your e-visit has been valuable and will speed your recovery. Thank you for using e-visits.  Greater than 5 minutes, yet less than 10 minutes of time have been spent researching, coordinating, and implementing care for this patient today.

## 2019-07-26 ENCOUNTER — Other Ambulatory Visit: Payer: BC Managed Care – PPO

## 2019-10-29 ENCOUNTER — Ambulatory Visit (INDEPENDENT_AMBULATORY_CARE_PROVIDER_SITE_OTHER): Payer: BC Managed Care – PPO

## 2019-10-29 ENCOUNTER — Ambulatory Visit (HOSPITAL_COMMUNITY)
Admission: EM | Admit: 2019-10-29 | Discharge: 2019-10-29 | Disposition: A | Discharge status: Home / Self Care | Payer: BC Managed Care – PPO | Attending: Urgent Care | Admitting: Urgent Care

## 2019-10-29 ENCOUNTER — Other Ambulatory Visit: Payer: Self-pay

## 2019-10-29 ENCOUNTER — Encounter (HOSPITAL_COMMUNITY): Payer: Self-pay

## 2019-10-29 DIAGNOSIS — M25572 Pain in left ankle and joints of left foot: Secondary | ICD-10-CM

## 2019-10-29 DIAGNOSIS — S93402A Sprain of unspecified ligament of left ankle, initial encounter: Secondary | ICD-10-CM

## 2019-10-29 DIAGNOSIS — Z3202 Encounter for pregnancy test, result negative: Secondary | ICD-10-CM

## 2019-10-29 LAB — POC URINE PREG, ED: Preg Test, Ur: NEGATIVE

## 2019-10-29 LAB — POCT PREGNANCY, URINE: Preg Test, Ur: NEGATIVE

## 2019-10-29 MED ORDER — NAPROXEN 500 MG PO TABS: 500.0000 mg | ORAL_TABLET | Freq: Two times a day (BID) | ORAL | 0 refills | Status: DC

## 2019-10-29 NOTE — ED Provider Notes (Addendum)
[place]   MRN: [id number] DOB: 28-Jul-1985  Subjective:   Robin Chapman is a 34 y.o. female presenting for suffering a left ankle injury last night.  Patient states that she accidentally twisted her ankle from trying to chase her dog.  She had severe pain from rolling her ankle and felt a pop.  Has had difficulty bearing weight, notes swelling around the ankle and has pain about her lateral and posterior ankle.  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  Has tried ibuprofen without relief.  No current facility-administered medications for this encounter.  Current Outpatient Medications:  .  benzonatate (TESSALON) 100 MG capsule, Take 1 capsule (100 mg total) by mouth 3 (three) times daily as needed for cough., Disp: 21 capsule, Rfl: 0 .  ipratropium (ATROVENT) 0.03 % nasal spray, Place 2 sprays into both nostrils 3 (three) times daily as needed for rhinitis., Disp: 30 mL, Rfl: 0 .  megestrol (MEGACE) 40 MG tablet, Take 2 tablets (80 mg total) by mouth 2 (two) times daily., Disp: 60 tablet, Rfl: 6   No Known Allergies  Past Medical History:  Diagnosis Date  . Abnormal Pap smear of vagina   . Medical history non-contributory      Past Surgical History:  Procedure Laterality Date  . CESAREAN SECTION    . DILATION AND CURETTAGE OF UTERUS    . HERNIA REPAIR      Family History  Problem Relation Age of Onset  . Cancer Maternal Grandfather        skin cancer  . Cancer Maternal Grandmother        breast  . Hypertension Maternal Grandmother   . Diabetes Paternal Grandmother     Social History   Tobacco Use  . Smoking status: Never Smoker  . Smokeless tobacco: Never Used  Substance Use Topics  . Alcohol use: No  . Drug use: No    ROS   Objective:   Vitals: BP 127/73   Pulse 79   Temp 97.9 F (36.6 C) (Oral)   Resp 16   Ht 5' (1.524 m)   Wt 200 lb (90.7 kg)   SpO2 99%   BMI 39.06 kg/m   Physical  Exam Constitutional:      General: She is not in acute distress.    Appearance: Normal appearance. She is well-developed. She is not ill-appearing, toxic-appearing or diaphoretic.  HENT:     Head: Normocephalic and atraumatic.     Nose: Nose normal.     Mouth/Throat:     Mouth: Mucous membranes are moist.     Pharynx: Oropharynx is clear.  Eyes:     General: No scleral icterus.    Extraocular Movements: Extraocular movements intact.     Pupils: Pupils are equal, round, and reactive to light.  Cardiovascular:     Rate and Rhythm: Normal rate.  Pulmonary:     Effort: Pulmonary effort is normal.  Musculoskeletal:     Left ankle: Swelling present. No deformity, ecchymosis or lacerations. Tenderness present over the lateral malleolus, ATF ligament and AITF ligament. Decreased range of motion.     Left Achilles Tendon: Tenderness present. No defects. Thompson's test negative.  Skin:    General: Skin is warm and dry.  Neurological:     General: No focal deficit present.     Mental Status: She is alert and oriented to person, place, and time.  Psychiatric:  Mood and Affect: Mood normal.        Behavior: Behavior normal.        Thought Content: Thought content normal.        Judgment: Judgment normal.     Results for orders placed or performed during the hospital encounter of 10/29/19 (from the past 24 hour(s))  POC urine pregnancy     Status: None   Collection Time: 10/29/19  9:04 AM  Result Value Ref Range   Preg Test, Ur NEGATIVE NEGATIVE  Pregnancy, urine POC     Status: None   Collection Time: 10/29/19  9:04 AM  Result Value Ref Range   Preg Test, Ur NEGATIVE NEGATIVE   DG Ankle Complete Left  Result Date: 10/29/2019 CLINICAL DATA:  Left ankle pain and injury EXAM: LEFT ANKLE COMPLETE - 3+ VIEW COMPARISON:  None. FINDINGS: There is no evidence of fracture, dislocation, or joint effusion. There is no evidence of arthropathy or other focal bone abnormality. Soft tissues  swelling around the ankle. IMPRESSION: No acute osseous injury of the left ankle. Electronically Signed   By: Elige Ko   On: 10/29/2019 09:40   Left ankle wrapped in figure-of-eight method using Ace wrap.  Assessment and Plan :   PDMP not reviewed this encounter.  1. Acute left ankle pain   2. Sprain of left ankle, unspecified ligament, initial encounter     Will manage for ankle sprain with rice method, NSAID. Counseled patient on potential for adverse effects with medications prescribed/recommended today, ER and return-to-clinic precautions discussed, patient verbalized understanding.    Wallis Bamberg, PA-C 10/29/19 1000    Wallis Bamberg, PA-C 10/29/19 1106

## 2019-10-29 NOTE — Discharge Instructions (Signed)
Ice for 20 minutes every 2 hours for the first 48 hours.

## 2019-10-29 NOTE — ED Triage Notes (Signed)
Pt twisted left ankle while trying to chase her dog. Pt states she heard the left ankle pop. Pt c/o 8/10 sharp throbbing pain in left ankle. Pt has 1+ edema of left ankle. Pt was able to walk to exam room.

## 2019-11-21 ENCOUNTER — Ambulatory Visit: Payer: BC Managed Care – PPO

## 2019-12-30 ENCOUNTER — Ambulatory Visit: Payer: BC Managed Care – PPO

## 2020-01-02 ENCOUNTER — Ambulatory Visit: Payer: BC Managed Care – PPO

## 2021-01-28 ENCOUNTER — Telehealth: Payer: BC Managed Care – PPO | Admitting: Physician Assistant

## 2021-01-28 DIAGNOSIS — Z5329 Procedure and treatment not carried out because of patient's decision for other reasons: Secondary | ICD-10-CM

## 2021-01-28 DIAGNOSIS — Z91199 Patient's noncompliance with other medical treatment and regimen due to unspecified reason: Secondary | ICD-10-CM

## 2021-01-28 NOTE — Progress Notes (Signed)
Patient was sent a direct link and provider waited over 30 minutes without any re-attempts for patient to log on. Marked No show.

## 2021-07-11 HISTORY — PX: OTHER SURGICAL HISTORY: SHX169

## 2021-11-18 ENCOUNTER — Ambulatory Visit
Admission: EM | Admit: 2021-11-18 | Discharge: 2021-11-18 | Disposition: A | Discharge status: Home / Self Care | Payer: BC Managed Care – PPO | Attending: Emergency Medicine | Admitting: Emergency Medicine

## 2021-11-18 DIAGNOSIS — R601 Generalized edema: Secondary | ICD-10-CM | POA: Diagnosis not present

## 2021-11-18 DIAGNOSIS — R030 Elevated blood-pressure reading, without diagnosis of hypertension: Secondary | ICD-10-CM | POA: Diagnosis not present

## 2021-11-18 MED ORDER — HYDROCHLOROTHIAZIDE 25 MG PO TABS: 25.0000 mg | ORAL_TABLET | Freq: Every morning | ORAL | 1 refills | Status: DC

## 2021-11-18 NOTE — ED Triage Notes (Signed)
Pt states swelling to legs and arms since last night. States her BP last night was high 143/92. ?

## 2021-11-18 NOTE — Discharge Instructions (Signed)
Check out the enclosed information about Dash eating plan. ? ?I challenge you to try one new activity this week that is fun and done in a group setting.  Check out the activities at your local rec center. ? ?Please begin hydrochlorothiazide, 1 tablet every day in the morning to help reduce the amount of fluid you have on board, reduce the swelling in your hands and feet and lower your blood pressure. ? ?Please follow-up with your primary care provider to discuss testing for elevated sugar, cholesterol, liver and kidney function and thyroid function.  Perhaps you could request a annual physical to have these done. ? ?Please also discuss with your primary provider possible referral to endocrinology for assistance with your PCOS and weight loss.  I do believe that they are intertwined and that you would benefit from at least a consult with endocrinology. ? ?Thank you for visiting urgent care today.  It was a pleasure speaking with you.  I wish you the very best in your journey.  Please let us know there is anything else we can do for you. ?

## 2021-11-18 NOTE — ED Provider Notes (Signed)
?[facility] ? ? ? ?CSN: [id number] ?Arrival date & time: 11/18/21  0934 ?  ? ?HISTORY  ? ?Chief Complaint  ?Patient presents with  ? Leg Swelling  ? Arm Swelling  ? ?HPI ?Robin Chapman is a 36 y.o. female. Patient presents urgent care today complaining of swelling in her legs and arms since last night.  Patient states that her last night her blood pressure was 143/92.  Blood pressure on arrival today was 136/86.  Patient denies chest pain, shortness of breath, headache, fatigue, paroxysmal nocturnal dyspnea, dry cough. ? ?The history is provided by the patient.  ?Past Medical History:  ?Diagnosis Date  ? Abnormal Pap smear of vagina   ? Medical history non-contributory   ? ?Patient Active Problem List  ? Diagnosis Date Noted  ? Abnormal uterine bleeding (AUB) 10/05/2017  ? 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 History of abnormal cervical Pap smear 07/31/2015  ? ?Past Surgical History:  ?Procedure Laterality Date  ? CESAREAN SECTION    ? DILATION AND CURETTAGE OF UTERUS    ? HERNIA REPAIR    ? ?OB History   ? ? Gravida  ?3  ? Para  ?2  ? Term  ?1  ? Preterm  ?1  ? AB  ?0  ? Living  ?2  ?  ? ? SAB  ?0  ? IAB  ?0  ? Ectopic  ?0  ? Multiple  ?0  ? Live Births  ?   ?   ?  ?  ? ?Home Medications   ? ?Prior to Admission medications   ?Not on File  ? ? ?Family History ?Family History  ?Problem Relation Age of Onset  ? Cancer Maternal Grandfather   ?     skin cancer  ? Cancer Maternal Grandmother   ?     breast  ? Hypertension Maternal Grandmother   ? Diabetes Paternal Grandmother   ? ?Social History ?Social History  ? ?Tobacco Use  ? Smoking status: Never  ? Smokeless tobacco: Never  ?Substance Use Topics  ? Alcohol use: No  ? Drug use: No  ? ?Allergies   ?Patient has no known allergies. ? ?Review of Systems ?Review of Systems ?Pertinent findings noted in history of present illness.  ? ?Physical Exam ?Triage Vital Signs ?ED Triage Vitals  ?Enc Vitals Group  ?   BP 05/07/21 0827 (!) 147/82  ?    Pulse Rate 05/07/21 0827 72  ?   Resp 05/07/21 0827 18  ?   Temp 05/07/21 0827 98.3 ?F (36.8 ?C)  ?   Temp Source 05/07/21 0827 Oral  ?   SpO2 05/07/21 0827 98 %  ?   Weight --   ?   Height --   ?   Head Circumference --   ?   Peak Flow --   ?   Pain Score 05/07/21 0826 5  ?   Pain Loc --   ?   Pain Edu? --   ?   Excl. in GC? --   ?No data found. ? ?Updated Vital Signs ?BP 136/86 (BP Location: Right Arm)   Pulse 84   Temp 97.7 ?F (36.5 ?C) (Oral)   Resp 16   LMP 11/16/2021 (Exact Date)   SpO2 96%  ? ?Physical Exam ?Vitals and nursing note reviewed.  ?Constitutional:   ?   General: She is not in acute distress. ?   Appearance: Normal appearance. She is not ill-appearing.  ?HENT:  ?  Head: Normocephalic and atraumatic.  ?Eyes:  ?   General: Lids are normal.     ?   Right eye: No discharge.     ?   Left eye: No discharge.  ?   Extraocular Movements: Extraocular movements intact.  ?   Conjunctiva/sclera: Conjunctivae normal.  ?   Right eye: Right conjunctiva is not injected.  ?   Left eye: Left conjunctiva is not injected.  ?Neck:  ?   Trachea: Trachea and phonation normal.  ?Cardiovascular:  ?   Rate and Rhythm: Normal rate and regular rhythm.  ?   Pulses: Normal pulses.  ?   Heart sounds: Normal heart sounds. No murmur heard. ?  No friction rub. No gallop.  ?Pulmonary:  ?   Effort: Pulmonary effort is normal. No accessory muscle usage, prolonged expiration or respiratory distress.  ?   Breath sounds: Normal breath sounds. No stridor, decreased air movement or transmitted upper airway sounds. No decreased breath sounds, wheezing, rhonchi or rales.  ?Chest:  ?   Chest wall: No tenderness.  ?Musculoskeletal:     ?   General: Normal range of motion.  ?   Cervical back: Normal range of motion and neck supple. Normal range of motion.  ?   Right lower leg: Edema present.  ?   Left lower leg: Edema present.  ?Lymphadenopathy:  ?   Cervical: No cervical adenopathy.  ?Skin: ?   General: Skin is warm and dry.  ?    Findings: No erythema or rash.  ?Neurological:  ?   General: No focal deficit present.  ?   Mental Status: She is alert and oriented to person, place, and time.  ?Psychiatric:     ?   Mood and Affect: Mood normal.     ?   Behavior: Behavior normal.  ? ? ?Visual Acuity ?Right Eye Distance:   ?Left Eye Distance:   ?Bilateral Distance:   ? ?Right Eye Near:   ?Left Eye Near:    ?Bilateral Near:    ? ?UC Couse / Diagnostics / Procedures:  ?  ?EKG ? ?Radiology ?No results found. ? ?Procedures ?Procedures (including critical care time) ? ?UC Diagnoses / Final Clinical Impressions(s)   ?I have reviewed the triage vital signs and the nursing notes. ? ?Pertinent labs & imaging results that were available during my care of the patient were reviewed by me and considered in my medical decision making (see chart for details).   ? ?Final diagnoses:  ?Generalized edema  ?Elevated blood pressure reading in office without diagnosis of hypertension  ? ?Patient advised to watch her sodium intake, consider ascribing to the DASH diet and begin hydrochlorothiazide.  Patient advised to follow-up with primary care for physical. ? ?ED Prescriptions   ? ? Medication Sig Dispense Auth. Provider  ? hydrochlorothiazide (HYDRODIURIL) 25 MG tablet Take 1 tablet (25 mg total) by mouth in the morning. 90 tablet Theadora RamaMorgan, Addylynn Balin Scales, PA-C  ? ?  ? ?PDMP not reviewed this encounter. ? ?Pending results:  ?Labs Reviewed - No data to display ? ?Medications Ordered in UC: ?Medications - No data to display ? ?Disposition Upon Discharge:  ?Condition: stable for discharge home ?Home: take medications as prescribed; routine discharge instructions as discussed; follow up as advised. ? ?Patient presented with an acute illness with associated systemic symptoms and significant discomfort requiring urgent management. In my opinion, this is a condition that a prudent lay person (someone who possesses an average knowledge of health and medicine)  may potentially  expect to result in complications if not addressed urgently such as respiratory distress, impairment of bodily function or dysfunction of bodily organs.  ? ?Routine symptom specific, illness specific and/or disease specific instructions were discussed with the patient and/or caregiver at length.  ? ?As such, the patient has been evaluated and assessed, work-up was performed and treatment was provided in alignment with urgent care protocols and evidence based medicine.  Patient/parent/caregiver has been advised that the patient may require follow up for further testing and treatment if the symptoms continue in spite of treatment, as clinically indicated and appropriate. ? ?Patient/parent/caregiver has been advised to return to the [facility] or PCP if no better; to PCP or the Emergency Department if new signs and symptoms develop, or if the current signs or symptoms continue to change or worsen for further workup, evaluation and treatment as clinically indicated and appropriate ? ?The patient will follow up with their current PCP if and as advised. If the patient does not currently have a PCP we will assist them in obtaining one.  ? ?The patient may need specialty follow up if the symptoms continue, in spite of conservative treatment and management, for further workup, evaluation, consultation and treatment as clinically indicated and appropriate. ? ? ?Patient/parent/caregiver verbalized understanding and agreement of plan as discussed.  All questions were addressed during visit.  Please see discharge instructions below for further details of plan. ? ?Discharge Instructions: ? ? ?Discharge Instructions   ? ?  ?Check out the enclosed information about Dash eating plan. ? ?I challenge you to try one new activity this week that is fun and done in a group setting.  Check out the activities at your local rec center. ? ?Please begin hydrochlorothiazide, 1 tablet every day in the morning to help reduce the amount of fluid you have  on board, reduce the swelling in your hands and feet and lower your blood pressure. ? ?Please follow-up with your primary care provider to discuss testing for elevated sugar, cholesterol, liver and kidney function and

## 2022-03-07 ENCOUNTER — Encounter (HOSPITAL_BASED_OUTPATIENT_CLINIC_OR_DEPARTMENT_OTHER): Payer: Self-pay | Admitting: Emergency Medicine

## 2022-03-07 ENCOUNTER — Inpatient Hospital Stay (EMERGENCY_DEPARTMENT_HOSPITAL)
Admission: AD | Admit: 2022-03-07 | Discharge: 2022-03-07 | Discharge status: Against Medical Advice | Payer: BC Managed Care – PPO | Source: Home / Self Care | Admitting: Obstetrics and Gynecology

## 2022-03-07 ENCOUNTER — Emergency Department (HOSPITAL_BASED_OUTPATIENT_CLINIC_OR_DEPARTMENT_OTHER)
Admission: EM | Admit: 2022-03-07 | Discharge: 2022-03-07 | Disposition: A | Discharge status: Home / Self Care | Payer: BC Managed Care – PPO | Attending: Emergency Medicine | Admitting: Emergency Medicine

## 2022-03-07 DIAGNOSIS — Z3202 Encounter for pregnancy test, result negative: Secondary | ICD-10-CM

## 2022-03-07 DIAGNOSIS — N939 Abnormal uterine and vaginal bleeding, unspecified: Secondary | ICD-10-CM | POA: Insufficient documentation

## 2022-03-07 LAB — WET PREP, GENITAL
Clue Cells Wet Prep HPF POC: NONE SEEN
Sperm: NONE SEEN
Trich, Wet Prep: NONE SEEN
WBC, Wet Prep HPF POC: <10 (ref <10)
Yeast Wet Prep HPF POC: NONE SEEN

## 2022-03-07 LAB — POCT PREGNANCY, URINE: Preg Test, Ur: NEGATIVE

## 2022-03-07 NOTE — MAU Note (Signed)
..  Robin Chapman is a 36 y.o. at Unknown here in MAU reporting: Spotting last week that has turned into heavy bleeding with blood clots. Abdominal pain when she is passing the blood clots.  Had a tubal ligation in 2012.  Pain score: 0/10 Vitals:   03/07/22 0047  BP: (!) 134/90  Pulse: 98  Resp: 16  Temp: 98.2 F (36.8 C)

## 2022-03-07 NOTE — MAU Provider Note (Signed)
Event Date/Time   First Provider Initiated Contact with Patient 03/07/22 0104      S Ms. Robin Chapman is a 36 y.o. [phone] patient who presents to MAU today with complaint of vaginal bleeding. Hx of BTL in 2012. Does not think she is pregnant. Reports heavy vaginal bleeding for the last 3 days. States she has to change her pad every 30 minutes & has been passing fist sized clots whenever she goes to the bathroom. Reports abdominal pain when she's passing clots but currently denies pain. Has felt lightheaded this evening. No LOC.   O BP (!) 134/90 (BP Location: Right Arm)   Pulse 98   Temp 98.2 F (36.8 C) (Oral)   Resp 16   Ht 5' (1.524 m)   Wt 110.5 kg   BMI 47.59 kg/m  Physical Exam Vitals and nursing note reviewed.  Constitutional:      General: She is not in acute distress.    Appearance: Normal appearance. She is not diaphoretic.  HENT:     Head: Normocephalic and atraumatic.  Eyes:     General: No scleral icterus.    Conjunctiva/sclera: Conjunctivae normal.  Pulmonary:     Effort: Pulmonary effort is normal. No respiratory distress.  Neurological:     Mental Status: She is alert.  Psychiatric:        Mood and Affect: Mood normal.        Behavior: Behavior normal.     A Medical screening exam complete 1. Episode of heavy vaginal bleeding   2. Negative pregnancy test      P Transferred to [facility] for further evaluation Report given to Dr. Clayborne Dana (EDP)  Judeth Horn, NP 03/07/2022 1:04 AM

## 2022-03-07 NOTE — ED Notes (Signed)
Called pt for triage, no response 

## 2022-03-07 NOTE — ED Provider Notes (Signed)
[facility] EMERGENCY DEPARTMENT Provider Note   CSN: [id number] Arrival date & time: 03/07/22  0246     History  Chief Complaint  Patient presents with   Vaginal Bleeding    Robin Chapman is a 36 y.o. female.  The history is provided by the patient.  Vaginal Bleeding Quality:  Bright red and clots Severity: light for one week and then severe today. 2 pads per hour per report. Duration:  1 week Timing:  Constant Progression:  Worsening Chronicity:  New Possible pregnancy: no   Context: spontaneously   Relieved by:  Nothing Worsened by:  Nothing Ineffective treatments:  None tried Associated symptoms: no fever   Risk factors: no bleeding disorder   Patient called her OB group and was referred to MAU who then referred patient to the the regular ED.      Home Medications Prior to Admission medications   Medication Sig Start Date End Date Taking? Authorizing Provider  hydrochlorothiazide (HYDRODIURIL) 25 MG tablet Take 1 tablet (25 mg total) by mouth in the morning. 11/18/21 05/17/22  Theadora Rama Scales, PA-C      Allergies    Patient has no known allergies.    Review of Systems   Review of Systems  Constitutional:  Negative for fever.  Eyes:  Negative for redness.  Respiratory:  Negative for wheezing and stridor.   Cardiovascular:  Negative for leg swelling.  Genitourinary:  Positive for vaginal bleeding.  All other systems reviewed and are negative.   Physical Exam Updated Vital Signs BP (!) 136/104 (BP Location: Right Arm)   Pulse 81   Temp 98 F (36.7 C) (Oral)   Resp 18   Wt 110.5 kg   LMP 02/26/2022   SpO2 100%   BMI 47.59 kg/m  Physical Exam Vitals and nursing note reviewed. Exam conducted with a chaperone present.  Constitutional:      General: She is not in acute distress.    Appearance: Normal appearance. She is well-developed.  HENT:     Head: Normocephalic and atraumatic.     Nose: Nose normal.  Eyes:      Pupils: Pupils are equal, round, and reactive to light.  Cardiovascular:     Rate and Rhythm: Normal rate and regular rhythm.     Pulses: Normal pulses.     Heart sounds: Normal heart sounds.  Pulmonary:     Effort: Pulmonary effort is normal. No respiratory distress.     Breath sounds: Normal breath sounds.  Abdominal:     General: Bowel sounds are normal. There is no distension.     Palpations: Abdomen is soft.     Tenderness: There is no abdominal tenderness. There is no guarding or rebound.  Genitourinary:    General: Normal vulva.     Vagina: No vaginal discharge.     Comments: Scant blood in vault.  No clots no lacerations  Musculoskeletal:        General: Normal range of motion.     Cervical back: Neck supple.  Skin:    General: Skin is dry.     Capillary Refill: Capillary refill takes less than 2 seconds.     Findings: No erythema or rash.  Neurological:     General: No focal deficit present.     Mental Status: She is alert.     Deep Tendon Reflexes: Reflexes normal.  Psychiatric:        Mood and Affect: Mood normal.     ED  Results / Procedures / Treatments   Labs (all labs ordered are listed, but only abnormal results are displayed) Labs Reviewed  WET PREP, GENITAL    EKG None  Radiology No results found.  Procedures Procedures    Medications Ordered in ED Medications - No data to display  ED Course/ Medical Decision Making/ A&P                           Medical Decision Making Amount and/or Complexity of Data Reviewed Labs: ordered. Discussion of management or test interpretation with external provider(s): Case d/w Dr. Henderson Cloud call office to be seen today.    Risk Risk Details: Bleeding has all but stopped in the ED.  No tachycardia or hypotension. Stable for discharge with close follow up.     Final Clinical Impression(s) / ED Diagnoses Final diagnoses:  Vaginal bleeding   Return for intractable cough, coughing up blood, fevers > 100.4  unrelieved by medication, shortness of breath, intractable vomiting, chest pain, shortness of breath, weakness, numbness, changes in speech, facial asymmetry, abdominal pain, passing out, Inability to tolerate liquids or food, cough, altered mental status or any concerns. No signs of systemic illness or infection. The patient is nontoxic-appearing on exam and vital signs are within normal limits.  I have reviewed the triage vital signs and the nursing notes. Pertinent labs & imaging results that were available during my care of the patient were reviewed by me and considered in my medical decision making (see chart for details). After history, exam, and medical workup I feel the patient has been appropriately medically screened and is safe for discharge home. Pertinent diagnoses were discussed with the patient. Patient was given return precautions. Rx / DC Orders ED Discharge Orders     None         Laylana Gerwig, MD 03/07/22 [phone]

## 2022-03-07 NOTE — ED Notes (Signed)
Called pt for triage multiple times, no response. Not seen in lobby

## 2022-03-07 NOTE — MAU Note (Signed)
Report given to [facility], [place] ED charge nurse.

## 2022-03-07 NOTE — ED Triage Notes (Signed)
About 1 week ago started having light spots, progressed into heavy bleeding and clots. Feels dehydrated now.

## 2023-01-20 ENCOUNTER — Other Ambulatory Visit: Payer: Self-pay

## 2023-01-20 ENCOUNTER — Encounter (HOSPITAL_BASED_OUTPATIENT_CLINIC_OR_DEPARTMENT_OTHER): Payer: Self-pay | Admitting: Obstetrics and Gynecology

## 2023-01-20 NOTE — Progress Notes (Addendum)
Spoke w/ via phone for pre-op interview---pt Lab needs dos---- urine preg  , surgery orders pending            Lab results------none COVID test -----patient states asymptomatic no test needed Arrive at -------530 am 01-31-2023 NPO after MN NO Solid Food.  Clear liquids from MN until---430 Med rec completed Medications to take morning of surgery -----none Diabetic medication -----n/a Patient instructed no nail polish to be worn day of surgery Patient instructed to bring photo id and insurance card day of surgery Patient aware to have Driver (ride ) / caregiver husband Robin Chapman   for 24 hours after surgery  Patient Special Instructions -----none Pre-Op special Instructions -----none Patient verbalized understanding of instructions that were given at this phone interview. Patient denies shortness of breath, chest pain, fever, cough at this phone interview.

## 2023-01-30 NOTE — H&P (Signed)
Robin Chapman is an 37 y.o. female. Presnets for hydrothermal ablation.   She has abnormal uterine bleeding that continues.  Previous saline infustion sono was normal   presents for ablation  Pertinent Gynecological History: Menses: flow is excessive with use of 6 pads or tampons on heaviest days Bleeding: dysfunctional uterine bleeding Contraception: tubal ligation DES exposure: denies Blood transfusions: none Sexually transmitted diseases: no past history Previous GYN Procedures:  hysteroscopy   Last mammogram:  na  Date: na Last pap  11?2023 OB History: G3, P1   Menstrual History: Menarche age: 62 Patient's last menstrual period was 01/16/2023.    Past Medical History:  Diagnosis Date   Abnormal Pap smear of vagina    Anemia    Elevated blood pressure reading in office without diagnosis of hypertension    PCOS (polycystic ovarian syndrome)     Past Surgical History:  Procedure Laterality Date   CESAREAN SECTION     x 2   DILATION AND CURETTAGE OF UTERUS  2010   HERNIA REPAIR     x 2   poly removed from cervix  2023   polyp removed from cervix  2023   right foot surgery     age 37    Family History  Problem Relation Age of Onset   Cancer Maternal Grandfather        skin cancer   Cancer Maternal Grandmother        breast   Hypertension Maternal Grandmother    Diabetes Paternal Grandmother     Social History:  reports that she has never smoked. She has never used smokeless tobacco. She reports that she does not drink alcohol and does not use drugs.  Allergies: No Known Allergies  No medications prior to admission.    Review of Systems  All other systems reviewed and are negative.   Height 5' (1.524 m), weight 112.9 kg, last menstrual period 01/16/2023. Physical Exam  No results found for this or any previous visit (from the past 24 hour(s)).  No results found.  Assessment/Plan: Abnormal uterine bleeding  Presents for hydrothermal  ablation.  Procedure explained.  Risk include:  infection,  hermorrahge that could require transfusion.  Injury to adjacent organs.  Dvt and PE.  Patient expresses understanding of risks and indications  Robin Chapman 01/30/2023, 12:46 PM

## 2023-01-30 NOTE — Anesthesia Preprocedure Evaluation (Addendum)
Anesthesia Evaluation  Patient identified by MRN, date of birth, ID band Patient awake    Reviewed: Allergy & Precautions, NPO status , Patient's Chart, lab work & pertinent test results  Airway Mallampati: II  TM Distance: >3 FB Neck ROM: Full    Dental no notable dental hx. (+) Teeth Intact, Dental Advisory Given   Pulmonary neg pulmonary ROS   Pulmonary exam normal breath sounds clear to auscultation       Cardiovascular negative cardio ROS Normal cardiovascular exam Rhythm:Regular Rate:Normal     Neuro/Psych negative neurological ROS     GI/Hepatic negative GI ROS, Neg liver ROS,,,  Endo/Other  negative endocrine ROS  Morbid obesity  Renal/GU      Musculoskeletal negative musculoskeletal ROS (+)    Abdominal  (+) + obese  Peds  Hematology Lab Results      Component                Value               Date                      WBC                      10.9 (H)            03/10/2023                HGB                      11.0 (L)            03/10/2023                HCT                      35.3 (L)            03/10/2023                MCV                      86.9                03/10/2023                PLT                      338                 03/10/2023              Anesthesia Other Findings   Reproductive/Obstetrics negative OB ROS                              Anesthesia Physical Anesthesia Plan  ASA: 2  Anesthesia Plan: General   Post-op Pain Management: Tylenol PO (pre-op)*, Precedex and Toradol IV (intra-op)*   Induction: Intravenous  PONV Risk Score and Plan: 4 or greater and Treatment may vary due to age or medical condition, Ondansetron, Midazolam and Dexamethasone  Airway Management Planned: LMA  Additional Equipment: None  Intra-op Plan:   Post-operative Plan: Extubation in OR  Informed Consent: I have reviewed the patients History and Physical, chart,  labs and discussed the procedure including the risks, benefits and alternatives for the proposed anesthesia with the patient  or authorized representative who has indicated his/her understanding and acceptance.     Dental advisory given  Plan Discussed with: CRNA  Anesthesia Plan Comments: (LMA GA)        Anesthesia Quick Evaluation

## 2023-01-31 DIAGNOSIS — N939 Abnormal uterine and vaginal bleeding, unspecified: Secondary | ICD-10-CM

## 2023-01-31 DIAGNOSIS — N914 Secondary oligomenorrhea: Secondary | ICD-10-CM

## 2023-01-31 DIAGNOSIS — Z01818 Encounter for other preprocedural examination: Secondary | ICD-10-CM

## 2023-02-24 ENCOUNTER — Other Ambulatory Visit: Payer: Self-pay

## 2023-02-24 ENCOUNTER — Encounter (HOSPITAL_BASED_OUTPATIENT_CLINIC_OR_DEPARTMENT_OTHER): Payer: Self-pay | Admitting: Obstetrics and Gynecology

## 2023-02-24 NOTE — Progress Notes (Signed)
Spoke w/ via phone for pre-op interview---pt Lab needs dos----  cbc, t & s, hiv urine preg             Lab results------none COVID test -----patient states asymptomatic no test needed Arrive at -------530 am 03-10-2023 NPO after MN NO Solid Food.  Clear liquids from MN until---430 am Med rec completed Medications to take morning of surgery -----none Diabetic medication -----takes ozempic for weight loss, last dose was 02-10-2023 Patient instructed no nail polish to be worn day of surgery Patient instructed to bring photo id and insurance card day of surgery Patient aware to have Driver (ride ) / caregiver    for 24 hours after surgery  husband Robin Chapman Patient Special Instructions -----none Pre-Op special Instructions -----none Patient verbalized understanding of instructions that were given at this phone interview. Patient denies shortness of breath, chest pain, fever, cough at this phone interview.

## 2023-03-03 NOTE — H&P (Signed)
NAME: Robin Chapman, COMPTON MEDICAL RECORD NO: [id number] ACCOUNT NO: [id number] DATE OF BIRTH: 17-Jan-1986 FACILITY: [facility] LOCATION: [facility] PHYSICIAN: Juluis Mire, MD  History and Physical   DATE OF ADMISSION: 01/20/2023  HISTORY OF PRESENT ILLNESS:  The patient is a 37 year old gravida 3, para 2, abortus 1 presents for hydrothermal ablation.  The patient has had a longstanding history of abnormal uterine bleeding.  She has had a previous hysteroscopy with resection of a  benign endometrial polyp.  Recent ultrasounds have been unremarkable.  The bleeding continues to be an issue with associated anemia.  Plan is for hydrothermal ablation.  She has had a previous bilateral tubal ligation at the time of her last cesarean  section.  ALLERGIES:  She has no known drug allergies.  MEDICATIONS:  She is on birth control pills at the present time to control the bleeding.  PAST MEDICAL HISTORY: She has had usual childhood illnesses.  She has had 2 prior cesarean section with bilateral tubal ligation.  Otherwise past medical history is unremarkable.  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  FAMILY HISTORY:  Noncontributory.  SOCIAL HISTORY:  Reveals no tobacco, alcohol use.  REVIEW OF SYSTEMS:  Noncontributory.  PHYSICAL EXAMINATION:. VITAL SIGNS:  Afebrile, stable vital signs. HEENT:  Patient is normocephalic.  Pupils equal, round, reactive to light and accommodation.  Extraocular movements are intact.  Sclerae and conjunctivae are clear.  Oropharynx clear. LUNGS:  Clear. CARDIOVASCULAR:  Regular rhythm and rate without murmurs or gallops. ABDOMEN:  Benign.  No mass, organomegaly, or tenderness.  Well-healed low transverse incision.  On pelvic normal external genitalia vaginal mucosa is clear.  Cervix unremarkable.  Uterus normal size, shape, and contour.  Adnexa free of masses or  tenderness. EXTREMITIES:  Trace edema. NEUROLOGIC:  Grossly within normal  limits.  ASSESSMENT:  Abnormal uterine bleeding with associated anemia.  PLAN:  The patient undergo hydrothermal ablation.  The nature of the procedure have been discussed.  The risk have been explained. The risks can include infection.  The risk of vascular injury that can lead to hemorrhage requiring transfusion or  hysterectomy.  Risk of perforation with injury to adjacent organs requiring further exploratory surgery.  Risk of deep venous thrombosis and pulmonary embolus.  The patient expressed understanding of indications and risks.     [email or web address] D: 03/03/2023 5:26:29 am T: 03/03/2023 6:45:00 am  JOB: [id number]/ [id number]

## 2023-03-03 NOTE — H&P (Signed)
Patient name  Robin Chapman, Robin Chapman DICTATION# [id number] GNF#[id number]  [name], MD 03/03/2023 5:27 AM

## 2023-03-10 ENCOUNTER — Other Ambulatory Visit: Payer: Self-pay

## 2023-03-10 ENCOUNTER — Encounter (HOSPITAL_BASED_OUTPATIENT_CLINIC_OR_DEPARTMENT_OTHER)
Admission: RE | Disposition: A | Discharge status: Home / Self Care | Payer: Self-pay | Source: Home / Self Care | Attending: Obstetrics and Gynecology

## 2023-03-10 ENCOUNTER — Ambulatory Visit (HOSPITAL_BASED_OUTPATIENT_CLINIC_OR_DEPARTMENT_OTHER)
Admission: RE | Admit: 2023-03-10 | Discharge: 2023-03-10 | Disposition: A | Discharge status: Home / Self Care | Payer: BC Managed Care – PPO | Attending: Obstetrics and Gynecology | Admitting: Obstetrics and Gynecology

## 2023-03-10 ENCOUNTER — Ambulatory Visit (HOSPITAL_BASED_OUTPATIENT_CLINIC_OR_DEPARTMENT_OTHER): Payer: BC Managed Care – PPO | Admitting: Anesthesiology

## 2023-03-10 ENCOUNTER — Encounter (HOSPITAL_BASED_OUTPATIENT_CLINIC_OR_DEPARTMENT_OTHER): Payer: Self-pay | Admitting: Obstetrics and Gynecology

## 2023-03-10 DIAGNOSIS — Z9851 Tubal ligation status: Secondary | ICD-10-CM | POA: Diagnosis not present

## 2023-03-10 DIAGNOSIS — Z98891 History of uterine scar from previous surgery: Secondary | ICD-10-CM | POA: Insufficient documentation

## 2023-03-10 DIAGNOSIS — Z6841 Body Mass Index (BMI) 40.0 and over, adult: Secondary | ICD-10-CM | POA: Insufficient documentation

## 2023-03-10 DIAGNOSIS — N920 Excessive and frequent menstruation with regular cycle: Secondary | ICD-10-CM | POA: Diagnosis present

## 2023-03-10 DIAGNOSIS — N939 Abnormal uterine and vaginal bleeding, unspecified: Secondary | ICD-10-CM

## 2023-03-10 DIAGNOSIS — Z01818 Encounter for other preprocedural examination: Secondary | ICD-10-CM

## 2023-03-10 DIAGNOSIS — N914 Secondary oligomenorrhea: Secondary | ICD-10-CM

## 2023-03-10 HISTORY — DX: Elevated blood-pressure reading, without diagnosis of hypertension: R03.0

## 2023-03-10 HISTORY — PX: DILITATION & CURRETTAGE/HYSTROSCOPY WITH HYDROTHERMAL ABLATION: SHX5570

## 2023-03-10 HISTORY — DX: Polycystic ovarian syndrome: E28.2

## 2023-03-10 HISTORY — DX: Anemia, unspecified: D64.9

## 2023-03-10 LAB — CBC
HCT: 35.3 % — ABNORMAL LOW (ref 36.0–46.0)
Hemoglobin: 11 g/dL — ABNORMAL LOW (ref 12.0–15.0)
MCH: 27.1 pg (ref 26.0–34.0)
MCHC: 31.2 g/dL (ref 30.0–36.0)
MCV: 86.9 fL (ref 80.0–100.0)
Platelets: 338 10*3/uL (ref 150–400)
RBC: 4.06 MIL/uL (ref 3.87–5.11)
RDW: 14 % (ref 11.5–15.5)
WBC: 10.9 10*3/uL — ABNORMAL HIGH (ref 4.0–10.5)
nRBC: 0 % (ref 0.0–0.2)

## 2023-03-10 LAB — HIV ANTIBODY (ROUTINE TESTING W REFLEX): HIV Screen 4th Generation wRfx: NONREACTIVE

## 2023-03-10 LAB — RPR: RPR Ser Ql: NONREACTIVE

## 2023-03-10 LAB — TYPE AND SCREEN
ABO/RH(D): A POS
Antibody Screen: NEGATIVE

## 2023-03-10 LAB — POCT PREGNANCY, URINE: Preg Test, Ur: NEGATIVE

## 2023-03-10 SURGERY — DILATATION & CURETTAGE/HYSTEROSCOPY WITH HYDROTHERMAL ABLATION
Anesthesia: General | Site: Vagina

## 2023-03-10 MED ORDER — DEXAMETHASONE SODIUM PHOSPHATE 10 MG/ML IJ SOLN
INTRAMUSCULAR | Status: DC | PRN
  Administered 2023-03-10: 10 mg via INTRAVENOUS

## 2023-03-10 MED ORDER — FENTANYL CITRATE (PF) 100 MCG/2ML IJ SOLN
INTRAMUSCULAR | Status: DC | PRN
  Administered 2023-03-10: 50 ug via INTRAVENOUS

## 2023-03-10 MED ORDER — OXYCODONE HCL 5 MG PO TABS: 5.0000 mg | ORAL_TABLET | Freq: Once | ORAL | Status: DC | PRN

## 2023-03-10 MED ORDER — PROPOFOL 500 MG/50ML IV EMUL
INTRAVENOUS | Status: AC
  Filled 2023-03-10: qty 50

## 2023-03-10 MED ORDER — ONDANSETRON HCL 4 MG/2ML IJ SOLN
4.0000 mg | Freq: Once | INTRAMUSCULAR | Status: AC | PRN
  Administered 2023-03-10: 4 mg via INTRAVENOUS

## 2023-03-10 MED ORDER — KETOROLAC TROMETHAMINE 30 MG/ML IJ SOLN
INTRAMUSCULAR | Status: DC | PRN
  Administered 2023-03-10: 30 mg via INTRAVENOUS

## 2023-03-10 MED ORDER — HYDROMORPHONE HCL 1 MG/ML IJ SOLN
0.2500 mg | INTRAMUSCULAR | Status: DC | PRN
  Administered 2023-03-10 (×2): 0.25 mg via INTRAVENOUS

## 2023-03-10 MED ORDER — OXYCODONE HCL 5 MG/5ML PO SOLN: 5.0000 mg | Freq: Once | ORAL | Status: DC | PRN

## 2023-03-10 MED ORDER — ONDANSETRON HCL 4 MG/2ML IJ SOLN
INTRAMUSCULAR | Status: AC
  Filled 2023-03-10: qty 2

## 2023-03-10 MED ORDER — LACTATED RINGERS IV SOLN
INTRAVENOUS | Status: DC
Start: 2023-03-10 — End: 2023-03-10

## 2023-03-10 MED ORDER — LACTATED RINGERS IV SOLN: INTRAVENOUS | Status: DC

## 2023-03-10 MED ORDER — ONDANSETRON HCL 4 MG/2ML IJ SOLN
INTRAMUSCULAR | Status: DC | PRN
  Administered 2023-03-10: 4 mg via INTRAVENOUS

## 2023-03-10 MED ORDER — LIDOCAINE HCL 1 % IJ SOLN
INTRAMUSCULAR | Status: DC | PRN
  Administered 2023-03-10: 10 mL

## 2023-03-10 MED ORDER — MIDAZOLAM HCL 5 MG/5ML IJ SOLN
INTRAMUSCULAR | Status: DC | PRN
  Administered 2023-03-10: 2 mg via INTRAVENOUS

## 2023-03-10 MED ORDER — ACETAMINOPHEN 500 MG PO TABS
1000.0000 mg | ORAL_TABLET | Freq: Once | ORAL | Status: AC
  Administered 2023-03-10: 1000 mg via ORAL

## 2023-03-10 MED ORDER — PROPOFOL 10 MG/ML IV BOLUS
INTRAVENOUS | Status: DC | PRN
Start: 2023-03-10 — End: 2023-03-10
  Administered 2023-03-10: 200 mg via INTRAVENOUS

## 2023-03-10 MED ORDER — MIDAZOLAM HCL 2 MG/2ML IJ SOLN
INTRAMUSCULAR | Status: AC
  Filled 2023-03-10: qty 2

## 2023-03-10 MED ORDER — FENTANYL CITRATE (PF) 100 MCG/2ML IJ SOLN
INTRAMUSCULAR | Status: AC
  Filled 2023-03-10: qty 2

## 2023-03-10 MED ORDER — LIDOCAINE HCL (PF) 2 % IJ SOLN
INTRAMUSCULAR | Status: AC
  Filled 2023-03-10: qty 5

## 2023-03-10 MED ORDER — KETOROLAC TROMETHAMINE 30 MG/ML IJ SOLN: 30.0000 mg | Freq: Once | INTRAMUSCULAR | Status: DC | PRN

## 2023-03-10 MED ORDER — POVIDONE-IODINE 10 % EX SWAB: 2.0000 | Freq: Once | CUTANEOUS | Status: DC

## 2023-03-10 MED ORDER — ACETAMINOPHEN 500 MG PO TABS
ORAL_TABLET | ORAL | Status: AC
  Filled 2023-03-10: qty 2

## 2023-03-10 MED ORDER — KETOROLAC TROMETHAMINE 30 MG/ML IJ SOLN
INTRAMUSCULAR | Status: AC
  Filled 2023-03-10: qty 1

## 2023-03-10 MED ORDER — DEXAMETHASONE SODIUM PHOSPHATE 10 MG/ML IJ SOLN
INTRAMUSCULAR | Status: AC
  Filled 2023-03-10: qty 1

## 2023-03-10 MED ORDER — HYDROMORPHONE HCL 1 MG/ML IJ SOLN
INTRAMUSCULAR | Status: AC
  Filled 2023-03-10: qty 1

## 2023-03-10 MED ORDER — SODIUM CHLORIDE 0.9 % IR SOLN
Status: DC | PRN
  Administered 2023-03-10 (×2): 3000 mL

## 2023-03-10 MED ORDER — LIDOCAINE 2% (20 MG/ML) 5 ML SYRINGE
INTRAMUSCULAR | Status: DC | PRN
  Administered 2023-03-10: 100 mg via INTRAVENOUS

## 2023-03-10 SURGICAL SUPPLY — 19 items
CATH ROBINSON RED A/P 16FR (CATHETERS) ×2 IMPLANT
DILATOR CANAL MILEX (MISCELLANEOUS) IMPLANT
DRSG TELFA 3X8 NADH STRL (GAUZE/BANDAGES/DRESSINGS) ×2 IMPLANT
GAUZE 4X4 16PLY ~~LOC~~+RFID DBL (SPONGE) ×4 IMPLANT
GLOVE BIO SURGEON STRL SZ7 (GLOVE) ×4 IMPLANT
GOWN STRL REUS W/TWL XL LVL3 (GOWN DISPOSABLE) ×2 IMPLANT
IV NS IRRIG 3000ML ARTHROMATIC (IV SOLUTION) ×4 IMPLANT
KIT PROCEDURE FLUENT (KITS) IMPLANT
KIT TURNOVER CYSTO (KITS) ×2 IMPLANT
NDL SPNL 18GX3.5 QUINCKE PK (NEEDLE) ×2 IMPLANT
NEEDLE SPNL 18GX3.5 QUINCKE PK (NEEDLE) ×1 IMPLANT
PACK VAGINAL MINOR WOMEN LF (CUSTOM PROCEDURE TRAY) ×2 IMPLANT
PAD OB MATERNITY 4.3X12.25 (PERSONAL CARE ITEMS) ×2 IMPLANT
PAD PREP 24X48 CUFFED NSTRL (MISCELLANEOUS) ×2 IMPLANT
SEAL ROD LENS SCOPE MYOSURE (ABLATOR) ×2 IMPLANT
SET GENESYS HTA PROCERVA (MISCELLANEOUS) ×2 IMPLANT
SLEEVE SCD COMPRESS KNEE MED (STOCKING) ×2 IMPLANT
TOWEL OR 17X24 6PK STRL BLUE (TOWEL DISPOSABLE) ×4 IMPLANT
WATER STERILE IRR 500ML POUR (IV SOLUTION) ×2 IMPLANT

## 2023-03-10 NOTE — Anesthesia Procedure Notes (Signed)
Procedure Name: LMA Insertion Date/Time: 03/10/2023 7:27 AM  Performed by: Briant Sites, CRNAPre-anesthesia Checklist: Patient identified, Emergency Drugs available, Suction available and Patient being monitored Patient Re-evaluated:Patient Re-evaluated prior to induction Oxygen Delivery Method: Circle system utilized Preoxygenation: Pre-oxygenation with 100% oxygen Induction Type: IV induction Ventilation: Mask ventilation without difficulty LMA: LMA with gastric port inserted LMA Size: 4.0 Number of attempts: 1 Placement Confirmation: positive ETCO2 Tube secured with: Tape Dental Injury: Teeth and Oropharynx as per pre-operative assessment

## 2023-03-10 NOTE — H&P (Signed)
  History and physical exam unchanged 

## 2023-03-10 NOTE — Transfer of Care (Signed)
Immediate Anesthesia Transfer of Care Note  Patient: Robin Chapman  Procedure(s) Performed: DILATATION & CURETTAGE/HYSTEROSCOPY WITH HYDROTHERMAL ABLATION (Vagina )  Patient Location: PACU  Anesthesia Type:General  Level of Consciousness: sedated  Airway & Oxygen Therapy: Patient Spontanous Breathing and Patient connected to nasal cannula oxygen  Post-op Assessment: Report given to RN  Post vital signs: Reviewed and stable  Last Vitals:  Vitals Value Taken Time  BP 154/103 03/10/23 0805  Temp    Pulse 89 03/10/23 0806  Resp 17 03/10/23 0806  SpO2 99 % 03/10/23 0806  Vitals shown include unfiled device data.  Last Pain:  Vitals:   03/10/23 0602  TempSrc: Oral  PainSc: 0-No pain      Patients Stated Pain Goal: 8 (03/10/23 0602)  Complications: No notable events documented.

## 2023-03-10 NOTE — Op Note (Signed)
NAME: Robin Chapman, MCMUNN MEDICAL RECORD NO: [id number] ACCOUNT NO: [id number] DATE OF BIRTH: 1985-08-29 FACILITY: [facility] LOCATION: [facility] PHYSICIAN: Juluis Mire, MD  Operative Report   DATE OF PROCEDURE: 03/10/2023  PREOPERATIVE DIAGNOSIS: Menorrhagia.  POSTOPERATIVE DIAGNOSIS: Menorrhagia.  OPERATIVE PROCEDURE:  Hydrothermal ablation.  SURGEON:  [name], MD  ANESTHESIA: General.  ESTIMATED BLOOD LOSS: Minimal.  PACKS AND DRAINS: None.  INTRAOPERATIVE BLOOD REPLACEMENT: None.  COMPLICATIONS: None.  INDICATIONS:  As dictated in history and physical.  PROCEDURE: As follows. The patient was taken to the OR, placed supine position.  After satisfactory level of general anesthesia obtained, she was placed in the dorsal lithotomy position using the Allen stirrups.  Perineum and vagina were prepped out with  Betadine and draped as a sterile field.  Speculum was placed in the vaginal vault.  Cervix was identified, grasped with a single tooth tenaculum.  Paracervical block of 1% Xylocaine was then instituted.  Uterus sounded to approximately 9 cm.  Cervix was  serially dilated.  Endometrial curettings were obtained and sent for pathological review.  Hysteroscope was introduced.  Endometrium appeared to be unremarkable.  Hydrothermal ablation was then undertaken in the usual manner without complications.  At  this point in time, we had complete blanching of the endometrium.  The hysteroscope was removed along with a single tooth tenaculum.  The patient was taken out of dorsal lithotomy position once alert and extubated, transferred to recovery room in good  condition.  Sponge, instrument, needle count was correct by circulating nurse x2.   [facility] D: 03/10/2023 8:15:06 am T: 03/10/2023 8:26:00 am  JOB: [id number]/ [id number]

## 2023-03-10 NOTE — Discharge Instructions (Signed)
     No acetaminophen/Tylenol until after 12:10 pm today if needed.  No ibuprofen, Advil, Aleve, Motrin, ketorolac, meloxicam, naproxen, or other NSAIDS until after 1:30 pm today if needed.   Post Anesthesia Home Care Instructions  Activity: Get plenty of rest for the remainder of the day. A responsible individual must stay with you for 24 hours following the procedure.  For the next 24 hours, DO NOT: -Drive a car -Advertising copywriter -Drink alcoholic beverages -Take any medication unless instructed by your physician -Make any legal decisions or sign important papers.  Meals: Start with liquid foods such as gelatin or soup. Progress to regular foods as tolerated. Avoid greasy, spicy, heavy foods. If nausea and/or vomiting occur, drink only clear liquids until the nausea and/or vomiting subsides. Call your physician if vomiting continues.  Special Instructions/Symptoms: Your throat may feel dry or sore from the anesthesia or the breathing tube placed in your throat during surgery. If this causes discomfort, gargle with warm salt water. The discomfort should disappear within 24 hours.

## 2023-03-10 NOTE — Anesthesia Postprocedure Evaluation (Signed)
Anesthesia Post Note  Patient: Robin Chapman  Procedure(s) Performed: DILATATION & CURETTAGE/HYSTEROSCOPY WITH HYDROTHERMAL ABLATION (Vagina )     Patient location during evaluation: PACU Anesthesia Type: General Level of consciousness: awake and alert Pain management: pain level controlled Vital Signs Assessment: post-procedure vital signs reviewed and stable Respiratory status: spontaneous breathing, nonlabored ventilation, respiratory function stable and patient connected to nasal cannula oxygen Cardiovascular status: blood pressure returned to baseline and stable Postop Assessment: no apparent nausea or vomiting Anesthetic complications: no   No notable events documented.  Last Vitals:  Vitals:   03/10/23 0900 03/10/23 0945  BP: (!) 157/98 (!) 150/94  Pulse: 79 90  Resp: 16 16  Temp:  36.6 C  SpO2: 95% 97%    Last Pain:  Vitals:   03/10/23 1015  TempSrc:   PainSc: 0-No pain                 Trevor Iha

## 2023-03-10 NOTE — Brief Op Note (Signed)
03/10/2023  8:19 AM  PATIENT:  Robin Chapman  37 y.o. female  PRE-OPERATIVE DIAGNOSIS:  HEAVY VAGINAL BLEEDING  POST-OPERATIVE DIAGNOSIS:  HEAVY VAGINAL BLEEDING  PROCEDURE:  Procedure(s): DILATATION & CURETTAGE/HYSTEROSCOPY WITH HYDROTHERMAL ABLATION (N/A)  SURGEON:  Surgeons and Role:    * [name], MD - Primary  PHYSICIAN ASSISTANT:   ASSISTANTS: none   ANESTHESIA:   general  EBL:  5 mL   BLOOD ADMINISTERED:none  DRAINS: none   LOCAL MEDICATIONS USED:  XYLOCAINE   SPECIMEN:  Source of Specimen:  endometrial curretting  DISPOSITION OF SPECIMEN:  PATHOLOGY  COUNTS:  YES  TOURNIQUET:  * No tourniquets in log *  DICTATION: .Other Dictation: Dictation Number [id number]  PLAN OF CARE: Discharge to home after PACU  PATIENT DISPOSITION:  PACU - hemodynamically stable.   Delay start of Pharmacological VTE agent (>24hrs) due to surgical blood loss or risk of bleeding: not applicable

## 2023-03-14 ENCOUNTER — Encounter (HOSPITAL_BASED_OUTPATIENT_CLINIC_OR_DEPARTMENT_OTHER): Payer: Self-pay | Admitting: Obstetrics and Gynecology

## 2023-03-16 LAB — SURGICAL PATHOLOGY

## 2024-01-22 ENCOUNTER — Emergency Department (HOSPITAL_BASED_OUTPATIENT_CLINIC_OR_DEPARTMENT_OTHER)
Admission: EM | Admit: 2024-01-22 | Discharge: 2024-01-22 | Disposition: A | Discharge status: Home / Self Care | Attending: Emergency Medicine | Admitting: Emergency Medicine

## 2024-01-22 ENCOUNTER — Encounter (HOSPITAL_BASED_OUTPATIENT_CLINIC_OR_DEPARTMENT_OTHER): Payer: Self-pay | Admitting: Emergency Medicine

## 2024-01-22 ENCOUNTER — Other Ambulatory Visit: Payer: Self-pay

## 2024-01-22 DIAGNOSIS — B029 Zoster without complications: Secondary | ICD-10-CM | POA: Diagnosis not present

## 2024-01-22 DIAGNOSIS — R21 Rash and other nonspecific skin eruption: Secondary | ICD-10-CM | POA: Diagnosis present

## 2024-01-22 MED ORDER — VALACYCLOVIR HCL 1 G PO TABS: 1000.0000 mg | ORAL_TABLET | Freq: Three times a day (TID) | ORAL | 0 refills | Status: DC

## 2024-01-22 MED ORDER — VALACYCLOVIR HCL 1 G PO TABS: 1000.0000 mg | ORAL_TABLET | Freq: Three times a day (TID) | ORAL | 0 refills | Status: AC

## 2024-01-22 MED ORDER — NAPROXEN 375 MG PO TABS: 375.0000 mg | ORAL_TABLET | Freq: Two times a day (BID) | ORAL | 0 refills | Status: DC

## 2024-01-22 MED ORDER — NAPROXEN 375 MG PO TABS: 375.0000 mg | ORAL_TABLET | Freq: Two times a day (BID) | ORAL | 0 refills | Status: AC

## 2024-01-22 NOTE — ED Provider Notes (Signed)
 [place] [facility] Provider Note   CSN: [id number] Arrival date & time: 01/22/24  1724     Patient presents with: Hip Pain   Robin Chapman is a 38 y.o. female.    Hip Pain   Patient presents ED with complaints of pain in her left hip lower abdomen area.  Patient states she started noticing symptoms last night.  Patient states pain goes across a band from her left lower back flank area towards the left groin area.  Patient states she has noticed a red area on the lateral aspect of her buttock/thigh.  Patient has not had any fevers or chills.  No falls or injury.    Prior to Admission medications   Medication Sig Start Date End Date Taking? Authorizing Provider  naproxen  (NAPROSYN ) 375 MG tablet Take 1 tablet (375 mg total) by mouth 2 (two) times daily. 01/22/24  Yes Randol Simmonds, MD  valACYclovir  (VALTREX ) 1000 MG tablet Take 1 tablet (1,000 mg total) by mouth 3 (three) times daily for 7 days. 01/22/24 01/29/24 Yes Randol Simmonds, MD  Semaglutide,0.25 or 0.5MG /DOS, (OZEMPIC, 0.25 OR 0.5 MG/DOSE,) 2 MG/1.5ML SOPN Inject into the skin.    [provider]  UNABLE TO FIND Vitamin b 12 injection    [provider]    Allergies: Patient has no known allergies.    Review of Systems  Updated Vital Signs BP (!) 135/91 (BP Location: Right Arm)   Pulse 84   Temp 98.7 F (37.1 C)   Resp 18   Ht 1.524 m (5')   Wt 118.8 kg   SpO2 99%   BMI 51.17 kg/m   Physical Exam Vitals and nursing note reviewed.  Constitutional:      General: She is not in acute distress.    Appearance: She is well-developed.  HENT:     Head: Normocephalic and atraumatic.     Right Ear: External ear normal.     Left Ear: External ear normal.  Eyes:     General: No scleral icterus.       Right eye: No discharge.        Left eye: No discharge.     Conjunctiva/sclera: Conjunctivae normal.  Neck:     Trachea: No tracheal deviation.   Cardiovascular:     Rate and Rhythm: Normal rate.  Pulmonary:     Effort: Pulmonary effort is normal. No respiratory distress.     Breath sounds: No stridor.  Abdominal:     General: There is no distension.  Musculoskeletal:        General: No swelling or deformity.     Cervical back: Neck supple.  Skin:    General: Skin is warm and dry.     Findings: Rash present.     Comments: Erythematous area going across the upper buttock extending from the lateral aspect towards the anterior aspect, no pustules or vesicles  Neurological:     Mental Status: She is alert. Mental status is at baseline.     Cranial Nerves: No dysarthria or facial asymmetry.     Motor: No seizure activity.     (all labs ordered are listed, but only abnormal results are displayed) Labs Reviewed - No data to display  EKG: None  Radiology: No results found.   Procedures   Medications Ordered in the ED - No data to display  Medical Decision Making Risk Prescription drug management.   Patient describes sharp pain in the area of her rash.  I suspect this may be an early case of shingles as she describes it going across only the left side.  There is an area of erythema of the skin.  No definite vesicles were pustules at this time but suspect this is shingles as opposed to cellulitis.  Patient did not have any falls or trauma.  Will discharge with course of Valtrex  and Naprosyn .     Final diagnoses:  Herpes zoster without complication    ED Discharge Orders          Ordered    valACYclovir  (VALTREX ) 1000 MG tablet  3 times daily        01/22/24 1848    naproxen  (NAPROSYN ) 375 MG tablet  2 times daily        01/22/24 AMOS Randol Simmonds, MD 01/22/24 [phone]

## 2024-01-22 NOTE — Discharge Instructions (Signed)
 The rash on your thigh area most likely is an early case of shingles.  Take the medications as prescribed.  Follow-up with your doctor in a few days to be rechecked.  The rash is likely to get worse and you will notice small blisters that will scab

## 2024-01-22 NOTE — ED Triage Notes (Signed)
 Pt POV c/o L hip pain since last night, with swelling to posterior L hip. Denies known injury. Pain worse with movement.
